# Patient Record
Sex: Female | Born: 1950 | Race: White | Hispanic: No | Smoking: Former smoker
Health system: Southern US, Community
[De-identification: ages and names within clinical notes are randomized; demographics above are authoritative.]

## PROBLEM LIST (undated history)

## (undated) DIAGNOSIS — K219 Gastro-esophageal reflux disease without esophagitis: Secondary | ICD-10-CM

## (undated) DIAGNOSIS — Z0000 Encounter for general adult medical examination without abnormal findings: Secondary | ICD-10-CM

## (undated) DIAGNOSIS — E785 Hyperlipidemia, unspecified: Secondary | ICD-10-CM

## (undated) DIAGNOSIS — F5104 Psychophysiologic insomnia: Secondary | ICD-10-CM

## (undated) DIAGNOSIS — M1990 Unspecified osteoarthritis, unspecified site: Secondary | ICD-10-CM

## (undated) DIAGNOSIS — G5603 Carpal tunnel syndrome, bilateral upper limbs: Secondary | ICD-10-CM

## (undated) DIAGNOSIS — D219 Benign neoplasm of connective and other soft tissue, unspecified: Secondary | ICD-10-CM

## (undated) DIAGNOSIS — Q669 Congenital deformity of feet, unspecified, unspecified foot: Secondary | ICD-10-CM

## (undated) DIAGNOSIS — Z720 Tobacco use: Secondary | ICD-10-CM

## (undated) DIAGNOSIS — G629 Polyneuropathy, unspecified: Secondary | ICD-10-CM

## (undated) DIAGNOSIS — D509 Iron deficiency anemia, unspecified: Secondary | ICD-10-CM

## (undated) DIAGNOSIS — Z62810 Personal history of physical and sexual abuse in childhood: Secondary | ICD-10-CM

## (undated) DIAGNOSIS — F329 Major depressive disorder, single episode, unspecified: Secondary | ICD-10-CM

## (undated) DIAGNOSIS — I4510 Unspecified right bundle-branch block: Secondary | ICD-10-CM

## (undated) DIAGNOSIS — L409 Psoriasis, unspecified: Secondary | ICD-10-CM

## (undated) DIAGNOSIS — F32A Depression, unspecified: Secondary | ICD-10-CM

## (undated) DIAGNOSIS — N393 Stress incontinence (female) (male): Secondary | ICD-10-CM

## (undated) HISTORY — DX: Polyneuropathy, unspecified: G62.9

## (undated) HISTORY — DX: Tobacco use: Z72.0

## (undated) HISTORY — DX: Stress incontinence (female) (male): N39.3

## (undated) HISTORY — DX: Psychophysiologic insomnia: F51.04

## (undated) HISTORY — DX: Hyperlipidemia, unspecified: E78.5

## (undated) HISTORY — DX: Benign neoplasm of connective and other soft tissue, unspecified: D21.9

## (undated) HISTORY — DX: Personal history of physical and sexual abuse in childhood: Z62.810

## (undated) HISTORY — DX: Iron deficiency anemia, unspecified: D50.9

## (undated) HISTORY — DX: Encounter for general adult medical examination without abnormal findings: Z00.00

## (undated) HISTORY — DX: Carpal tunnel syndrome, bilateral upper limbs: G56.03

## (undated) HISTORY — DX: Major depressive disorder, single episode, unspecified: F32.9

## (undated) HISTORY — DX: Depression, unspecified: F32.A

## (undated) HISTORY — PX: OTHER SURGICAL HISTORY: SHX169

## (undated) HISTORY — DX: Unspecified right bundle-branch block: I45.10

## (undated) HISTORY — DX: Gastro-esophageal reflux disease without esophagitis: K21.9

## (undated) HISTORY — DX: Congenital deformity of feet, unspecified, unspecified foot: Q66.90

## (undated) HISTORY — DX: Psoriasis, unspecified: L40.9

## (undated) HISTORY — DX: Unspecified osteoarthritis, unspecified site: M19.90

---

## 2001-03-27 ENCOUNTER — Emergency Department (HOSPITAL_COMMUNITY): Admission: EM | Admit: 2001-03-27 | Discharge: 2001-03-27 | Payer: Self-pay | Admitting: Emergency Medicine

## 2001-04-17 ENCOUNTER — Other Ambulatory Visit: Admission: RE | Admit: 2001-04-17 | Discharge: 2001-04-17 | Payer: Self-pay | Admitting: Obstetrics and Gynecology

## 2003-07-19 ENCOUNTER — Encounter: Admission: RE | Admit: 2003-07-19 | Discharge: 2003-07-19 | Payer: Self-pay | Admitting: Internal Medicine

## 2003-07-25 ENCOUNTER — Encounter: Admission: RE | Admit: 2003-07-25 | Discharge: 2003-07-25 | Payer: Self-pay | Admitting: Internal Medicine

## 2003-08-02 ENCOUNTER — Encounter (INDEPENDENT_AMBULATORY_CARE_PROVIDER_SITE_OTHER): Payer: Self-pay | Admitting: Internal Medicine

## 2003-08-02 ENCOUNTER — Encounter: Admission: RE | Admit: 2003-08-02 | Discharge: 2003-08-02 | Payer: Self-pay | Admitting: Internal Medicine

## 2003-09-05 ENCOUNTER — Encounter: Admission: RE | Admit: 2003-09-05 | Discharge: 2003-09-05 | Payer: Self-pay | Admitting: Internal Medicine

## 2003-09-06 ENCOUNTER — Encounter (INDEPENDENT_AMBULATORY_CARE_PROVIDER_SITE_OTHER): Payer: Self-pay | Admitting: Internal Medicine

## 2003-12-26 ENCOUNTER — Encounter: Admission: RE | Admit: 2003-12-26 | Discharge: 2003-12-26 | Payer: Self-pay | Admitting: Internal Medicine

## 2004-07-13 ENCOUNTER — Emergency Department (HOSPITAL_COMMUNITY): Admission: EM | Admit: 2004-07-13 | Discharge: 2004-07-13 | Payer: Self-pay | Admitting: Family Medicine

## 2004-09-04 ENCOUNTER — Ambulatory Visit: Payer: Self-pay | Admitting: Internal Medicine

## 2004-09-16 ENCOUNTER — Ambulatory Visit (HOSPITAL_COMMUNITY): Admission: RE | Admit: 2004-09-16 | Discharge: 2004-09-16 | Payer: Self-pay | Admitting: Internal Medicine

## 2004-11-27 ENCOUNTER — Ambulatory Visit: Payer: Self-pay | Admitting: Internal Medicine

## 2005-01-26 ENCOUNTER — Encounter (INDEPENDENT_AMBULATORY_CARE_PROVIDER_SITE_OTHER): Payer: Self-pay | Admitting: Internal Medicine

## 2005-01-26 ENCOUNTER — Ambulatory Visit (HOSPITAL_COMMUNITY): Admission: RE | Admit: 2005-01-26 | Discharge: 2005-01-26 | Payer: Self-pay | Admitting: Internal Medicine

## 2005-04-19 ENCOUNTER — Emergency Department (HOSPITAL_COMMUNITY): Admission: EM | Admit: 2005-04-19 | Discharge: 2005-04-19 | Payer: Self-pay | Admitting: Family Medicine

## 2005-05-20 ENCOUNTER — Ambulatory Visit: Payer: Self-pay | Admitting: Internal Medicine

## 2005-11-30 ENCOUNTER — Emergency Department (HOSPITAL_COMMUNITY): Admission: EM | Admit: 2005-11-30 | Discharge: 2005-11-30 | Payer: Self-pay | Admitting: Family Medicine

## 2005-12-21 ENCOUNTER — Ambulatory Visit (HOSPITAL_COMMUNITY): Admission: RE | Admit: 2005-12-21 | Discharge: 2005-12-21 | Payer: Self-pay | Admitting: Hospitalist

## 2005-12-21 ENCOUNTER — Ambulatory Visit: Payer: Self-pay | Admitting: Hospitalist

## 2006-03-17 ENCOUNTER — Ambulatory Visit: Payer: Self-pay | Admitting: Hospitalist

## 2006-07-12 ENCOUNTER — Ambulatory Visit: Payer: Self-pay | Admitting: Internal Medicine

## 2006-10-22 DIAGNOSIS — F17200 Nicotine dependence, unspecified, uncomplicated: Secondary | ICD-10-CM

## 2006-10-22 DIAGNOSIS — M129 Arthropathy, unspecified: Secondary | ICD-10-CM | POA: Insufficient documentation

## 2006-10-22 DIAGNOSIS — K219 Gastro-esophageal reflux disease without esophagitis: Secondary | ICD-10-CM

## 2007-09-11 ENCOUNTER — Ambulatory Visit: Payer: Self-pay | Admitting: Internal Medicine

## 2007-09-11 ENCOUNTER — Encounter (INDEPENDENT_AMBULATORY_CARE_PROVIDER_SITE_OTHER): Payer: Self-pay | Admitting: Internal Medicine

## 2007-12-19 ENCOUNTER — Emergency Department (HOSPITAL_COMMUNITY): Admission: EM | Admit: 2007-12-19 | Discharge: 2007-12-19 | Payer: Self-pay | Admitting: Emergency Medicine

## 2008-01-11 ENCOUNTER — Ambulatory Visit (HOSPITAL_COMMUNITY): Admission: RE | Admit: 2008-01-11 | Discharge: 2008-01-11 | Payer: Self-pay | Admitting: Internal Medicine

## 2008-01-11 ENCOUNTER — Ambulatory Visit: Payer: Self-pay | Admitting: Internal Medicine

## 2008-01-15 ENCOUNTER — Telehealth (INDEPENDENT_AMBULATORY_CARE_PROVIDER_SITE_OTHER): Payer: Self-pay | Admitting: Internal Medicine

## 2008-01-29 ENCOUNTER — Emergency Department (HOSPITAL_COMMUNITY): Admission: EM | Admit: 2008-01-29 | Discharge: 2008-01-29 | Payer: Self-pay | Admitting: Family Medicine

## 2008-03-06 ENCOUNTER — Ambulatory Visit: Payer: Self-pay | Admitting: *Deleted

## 2008-03-14 ENCOUNTER — Telehealth (INDEPENDENT_AMBULATORY_CARE_PROVIDER_SITE_OTHER): Payer: Self-pay | Admitting: Internal Medicine

## 2008-03-15 ENCOUNTER — Ambulatory Visit: Payer: Self-pay | Admitting: Internal Medicine

## 2008-03-15 ENCOUNTER — Encounter (INDEPENDENT_AMBULATORY_CARE_PROVIDER_SITE_OTHER): Payer: Self-pay | Admitting: *Deleted

## 2008-03-15 ENCOUNTER — Encounter: Payer: Self-pay | Admitting: Licensed Clinical Social Worker

## 2008-03-15 DIAGNOSIS — G4700 Insomnia, unspecified: Secondary | ICD-10-CM

## 2008-03-15 LAB — CONVERTED CEMR LAB
Anti Nuclear Antibody(ANA): NEGATIVE
Basophils Absolute: 0 10*3/uL (ref 0.0–0.1)
Basophils Relative: 0 % (ref 0–1)
Eosinophils Absolute: 0.1 10*3/uL (ref 0.0–0.7)
Eosinophils Relative: 1 % (ref 0–5)
HCT: 38.6 % (ref 36.0–46.0)
Hemoglobin: 12.1 g/dL (ref 12.0–15.0)
Lymphocytes Relative: 29 % (ref 12–46)
Lymphs Abs: 1.8 10*3/uL (ref 0.7–4.0)
MCHC: 31.3 g/dL (ref 30.0–36.0)
MCV: 83.9 fL (ref 78.0–100.0)
Monocytes Absolute: 0.4 10*3/uL (ref 0.1–1.0)
Monocytes Relative: 6 % (ref 3–12)
Neutro Abs: 3.9 10*3/uL (ref 1.7–7.7)
Neutrophils Relative %: 64 % (ref 43–77)
Platelets: 301 10*3/uL (ref 150–400)
RBC: 4.6 M/uL (ref 3.87–5.11)
RDW: 16 % — ABNORMAL HIGH (ref 11.5–15.5)
Rheumatoid fact SerPl-aCnc: <20 intl units/mL (ref 0–20)
Sed Rate: 7 mm/hr (ref 0–22)
WBC: 6.2 10*3/uL (ref 4.0–10.5)

## 2008-03-25 ENCOUNTER — Ambulatory Visit: Payer: Self-pay | Admitting: Internal Medicine

## 2008-03-25 DIAGNOSIS — L408 Other psoriasis: Secondary | ICD-10-CM

## 2008-03-25 DIAGNOSIS — N39498 Other specified urinary incontinence: Secondary | ICD-10-CM | POA: Insufficient documentation

## 2008-04-09 ENCOUNTER — Telehealth (INDEPENDENT_AMBULATORY_CARE_PROVIDER_SITE_OTHER): Payer: Self-pay | Admitting: Infectious Diseases

## 2008-05-15 ENCOUNTER — Encounter: Payer: Self-pay | Admitting: Obstetrics and Gynecology

## 2008-05-15 ENCOUNTER — Ambulatory Visit: Payer: Self-pay | Admitting: Obstetrics and Gynecology

## 2008-05-29 ENCOUNTER — Ambulatory Visit (HOSPITAL_COMMUNITY): Admission: RE | Admit: 2008-05-29 | Discharge: 2008-05-29 | Payer: Self-pay | Admitting: Gynecology

## 2008-06-05 ENCOUNTER — Encounter (INDEPENDENT_AMBULATORY_CARE_PROVIDER_SITE_OTHER): Payer: Self-pay | Admitting: Internal Medicine

## 2008-06-13 ENCOUNTER — Other Ambulatory Visit: Admission: RE | Admit: 2008-06-13 | Discharge: 2008-06-13 | Payer: Self-pay | Admitting: Obstetrics and Gynecology

## 2008-06-13 ENCOUNTER — Ambulatory Visit: Payer: Self-pay | Admitting: Obstetrics & Gynecology

## 2008-06-28 ENCOUNTER — Ambulatory Visit: Payer: Self-pay | Admitting: *Deleted

## 2008-07-03 ENCOUNTER — Ambulatory Visit: Payer: Self-pay | Admitting: Infectious Diseases

## 2008-07-03 ENCOUNTER — Ambulatory Visit (HOSPITAL_COMMUNITY): Admission: RE | Admit: 2008-07-03 | Discharge: 2008-07-03 | Payer: Self-pay | Admitting: Family Medicine

## 2008-07-11 ENCOUNTER — Ambulatory Visit: Payer: Self-pay | Admitting: Obstetrics and Gynecology

## 2008-08-20 ENCOUNTER — Telehealth: Payer: Self-pay | Admitting: *Deleted

## 2008-09-05 ENCOUNTER — Ambulatory Visit: Payer: Self-pay | Admitting: Internal Medicine

## 2008-09-05 ENCOUNTER — Encounter (INDEPENDENT_AMBULATORY_CARE_PROVIDER_SITE_OTHER): Payer: Self-pay | Admitting: *Deleted

## 2008-09-05 DIAGNOSIS — K5909 Other constipation: Secondary | ICD-10-CM

## 2008-09-17 ENCOUNTER — Encounter (INDEPENDENT_AMBULATORY_CARE_PROVIDER_SITE_OTHER): Payer: Self-pay | Admitting: *Deleted

## 2008-09-19 ENCOUNTER — Telehealth (INDEPENDENT_AMBULATORY_CARE_PROVIDER_SITE_OTHER): Payer: Self-pay | Admitting: *Deleted

## 2008-09-19 ENCOUNTER — Encounter (INDEPENDENT_AMBULATORY_CARE_PROVIDER_SITE_OTHER): Payer: Self-pay | Admitting: *Deleted

## 2008-09-20 ENCOUNTER — Encounter (INDEPENDENT_AMBULATORY_CARE_PROVIDER_SITE_OTHER): Payer: Self-pay | Admitting: *Deleted

## 2008-09-20 ENCOUNTER — Telehealth (INDEPENDENT_AMBULATORY_CARE_PROVIDER_SITE_OTHER): Payer: Self-pay | Admitting: *Deleted

## 2008-09-20 ENCOUNTER — Ambulatory Visit: Payer: Self-pay | Admitting: Infectious Disease

## 2008-09-20 LAB — CONVERTED CEMR LAB
HCT: 36.9 % (ref 36.0–46.0)
Hemoglobin: 11.1 g/dL — ABNORMAL LOW (ref 12.0–15.0)
MCHC: 30.1 g/dL (ref 30.0–36.0)
MCV: 81.6 fL (ref 78.0–100.0)
Platelets: 321 10*3/uL (ref 150–400)
RBC Folate: 624 ng/mL — ABNORMAL HIGH (ref 180–600)
RBC: 4.52 M/uL (ref 3.87–5.11)
RDW: 15.8 % — ABNORMAL HIGH (ref 11.5–15.5)
TSH: 1.8 microintl units/mL (ref 0.350–4.50)
WBC: 5.5 10*3/uL (ref 4.0–10.5)

## 2008-09-30 DIAGNOSIS — D509 Iron deficiency anemia, unspecified: Secondary | ICD-10-CM

## 2008-09-30 LAB — CONVERTED CEMR LAB
Ferritin: 5 ng/mL — ABNORMAL LOW (ref 10–291)
Haptoglobin: 245 mg/dL — ABNORMAL HIGH (ref 16–200)
Iron: 53 ug/dL (ref 42–145)
LDH: 177 units/L (ref 94–250)
Saturation Ratios: 13 % — ABNORMAL LOW (ref 20–55)
TIBC: 414 ug/dL (ref 250–470)
Transferrin: 336 mg/dL (ref 212–360)
UIBC: 361 ug/dL
Vitamin B-12: 249 pg/mL (ref 211–911)

## 2008-10-01 ENCOUNTER — Telehealth (INDEPENDENT_AMBULATORY_CARE_PROVIDER_SITE_OTHER): Payer: Self-pay | Admitting: *Deleted

## 2008-10-03 ENCOUNTER — Ambulatory Visit: Payer: Self-pay | Admitting: Internal Medicine

## 2008-10-03 ENCOUNTER — Encounter (INDEPENDENT_AMBULATORY_CARE_PROVIDER_SITE_OTHER): Payer: Self-pay | Admitting: *Deleted

## 2008-10-03 ENCOUNTER — Telehealth (INDEPENDENT_AMBULATORY_CARE_PROVIDER_SITE_OTHER): Payer: Self-pay | Admitting: *Deleted

## 2008-10-07 ENCOUNTER — Ambulatory Visit (HOSPITAL_COMMUNITY): Admission: RE | Admit: 2008-10-07 | Discharge: 2008-10-07 | Payer: Self-pay | Admitting: Internal Medicine

## 2008-10-07 ENCOUNTER — Ambulatory Visit (HOSPITAL_COMMUNITY): Admission: RE | Admit: 2008-10-07 | Discharge: 2008-10-07 | Payer: Self-pay | Admitting: Obstetrics and Gynecology

## 2008-10-07 ENCOUNTER — Ambulatory Visit: Payer: Self-pay | Admitting: Internal Medicine

## 2008-10-07 ENCOUNTER — Encounter (INDEPENDENT_AMBULATORY_CARE_PROVIDER_SITE_OTHER): Payer: Self-pay | Admitting: *Deleted

## 2008-10-10 ENCOUNTER — Ambulatory Visit: Payer: Self-pay | Admitting: Obstetrics & Gynecology

## 2008-10-15 ENCOUNTER — Encounter (INDEPENDENT_AMBULATORY_CARE_PROVIDER_SITE_OTHER): Payer: Self-pay | Admitting: *Deleted

## 2008-10-15 ENCOUNTER — Ambulatory Visit (HOSPITAL_COMMUNITY): Admission: RE | Admit: 2008-10-15 | Discharge: 2008-10-15 | Payer: Self-pay | Admitting: *Deleted

## 2008-11-12 ENCOUNTER — Encounter: Admission: RE | Admit: 2008-11-12 | Discharge: 2008-11-12 | Payer: Self-pay | Admitting: Orthopedic Surgery

## 2008-11-18 ENCOUNTER — Telehealth (INDEPENDENT_AMBULATORY_CARE_PROVIDER_SITE_OTHER): Payer: Self-pay | Admitting: *Deleted

## 2008-11-21 ENCOUNTER — Ambulatory Visit: Payer: Self-pay | Admitting: Obstetrics & Gynecology

## 2009-01-01 ENCOUNTER — Encounter: Admission: RE | Admit: 2009-01-01 | Discharge: 2009-04-01 | Payer: Self-pay | Admitting: Orthopedic Surgery

## 2009-01-13 HISTORY — PX: VAGINAL HYSTERECTOMY: SUR661

## 2009-01-21 ENCOUNTER — Encounter: Payer: Self-pay | Admitting: Obstetrics & Gynecology

## 2009-01-21 ENCOUNTER — Inpatient Hospital Stay (HOSPITAL_COMMUNITY): Admission: RE | Admit: 2009-01-21 | Discharge: 2009-01-22 | Payer: Self-pay | Admitting: Obstetrics & Gynecology

## 2009-01-21 ENCOUNTER — Ambulatory Visit: Payer: Self-pay | Admitting: Obstetrics & Gynecology

## 2009-01-24 ENCOUNTER — Encounter (INDEPENDENT_AMBULATORY_CARE_PROVIDER_SITE_OTHER): Payer: Self-pay | Admitting: *Deleted

## 2009-02-17 ENCOUNTER — Telehealth (INDEPENDENT_AMBULATORY_CARE_PROVIDER_SITE_OTHER): Payer: Self-pay | Admitting: *Deleted

## 2009-02-18 ENCOUNTER — Telehealth (INDEPENDENT_AMBULATORY_CARE_PROVIDER_SITE_OTHER): Payer: Self-pay | Admitting: *Deleted

## 2009-02-26 ENCOUNTER — Encounter: Payer: Self-pay | Admitting: Obstetrics and Gynecology

## 2009-02-26 ENCOUNTER — Ambulatory Visit: Payer: Self-pay | Admitting: Obstetrics & Gynecology

## 2009-02-26 LAB — CONVERTED CEMR LAB
Clue Cells Wet Prep HPF POC: NONE SEEN
Trich, Wet Prep: NONE SEEN
WBC, Wet Prep HPF POC: NONE SEEN
Yeast Wet Prep HPF POC: NONE SEEN

## 2009-03-18 ENCOUNTER — Encounter (INDEPENDENT_AMBULATORY_CARE_PROVIDER_SITE_OTHER): Payer: Self-pay | Admitting: *Deleted

## 2009-03-18 ENCOUNTER — Ambulatory Visit: Payer: Self-pay | Admitting: Internal Medicine

## 2009-03-25 LAB — CONVERTED CEMR LAB
BUN: 12 mg/dL (ref 6–23)
Basophils Absolute: 0 10*3/uL (ref 0.0–0.1)
Basophils Relative: 0 % (ref 0–1)
CO2: 25 meq/L (ref 19–32)
Calcium: 9.7 mg/dL (ref 8.4–10.5)
Chloride: 103 meq/L (ref 96–112)
Creatinine, Ser: 0.71 mg/dL (ref 0.40–1.20)
Eosinophils Absolute: 0 10*3/uL (ref 0.0–0.7)
Eosinophils Relative: 1 % (ref 0–5)
GFR calc Af Amer: >60 mL/min (ref >60)
GFR calc non Af Amer: >60 mL/min (ref >60)
Glucose, Bld: 105 mg/dL — ABNORMAL HIGH (ref 70–99)
HCT: 39.7 % (ref 36.0–46.0)
Hemoglobin: 12.2 g/dL (ref 12.0–15.0)
Lymphocytes Relative: 33 % (ref 12–46)
Lymphs Abs: 1.7 10*3/uL (ref 0.7–4.0)
MCHC: 30.7 g/dL (ref 30.0–36.0)
MCV: 83.4 fL (ref 78.0–100.0)
Monocytes Absolute: 0.2 10*3/uL (ref 0.1–1.0)
Monocytes Relative: 4 % (ref 3–12)
Neutro Abs: 3.2 10*3/uL (ref 1.7–7.7)
Neutrophils Relative %: 62 % (ref 43–77)
Platelets: 287 10*3/uL (ref 150–400)
Potassium: 4.4 meq/L (ref 3.5–5.3)
RBC: 4.76 M/uL (ref 3.87–5.11)
RDW: 17.9 % — ABNORMAL HIGH (ref 11.5–15.5)
Sodium: 140 meq/L (ref 135–145)
Vitamin B-12: 262 pg/mL (ref 211–911)
WBC: 5.2 10*3/uL (ref 4.0–10.5)

## 2009-04-02 ENCOUNTER — Encounter: Admission: RE | Admit: 2009-04-02 | Discharge: 2009-07-01 | Payer: Self-pay | Admitting: Orthopedic Surgery

## 2009-04-18 ENCOUNTER — Ambulatory Visit: Payer: Self-pay | Admitting: Infectious Disease

## 2009-06-02 ENCOUNTER — Encounter (INDEPENDENT_AMBULATORY_CARE_PROVIDER_SITE_OTHER): Payer: Self-pay | Admitting: *Deleted

## 2009-06-24 IMAGING — CR DG FEET 3 VIEWS BILAT
6 series · 6 of 6 positions shown · non-contrast
Comparison: none

CLINICAL DATA: Increased pain.  
BILATERAL ANKLE ?3 VIEW:

[t foot ap left]
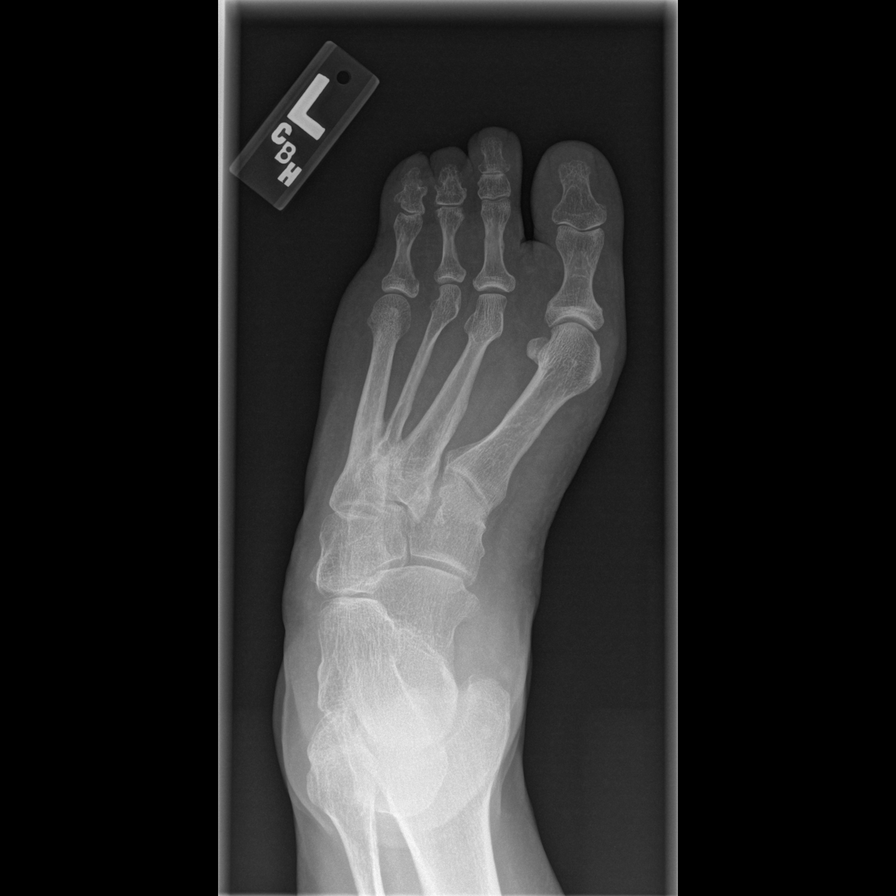

[t foot oblique left]
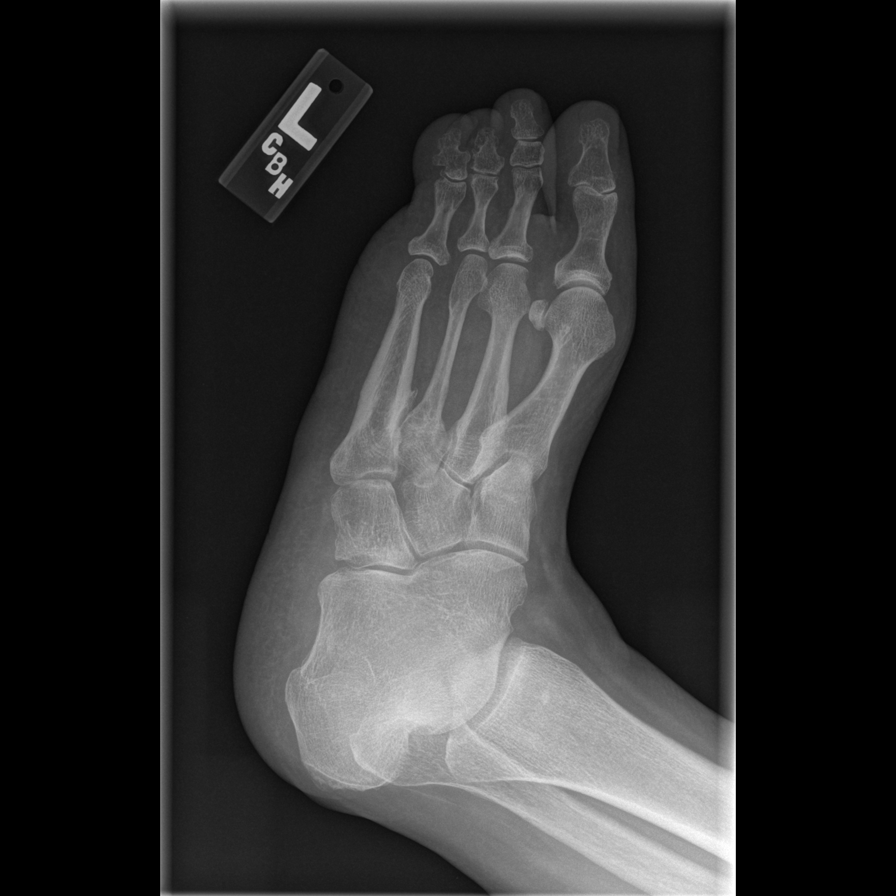

[t foot lat left]
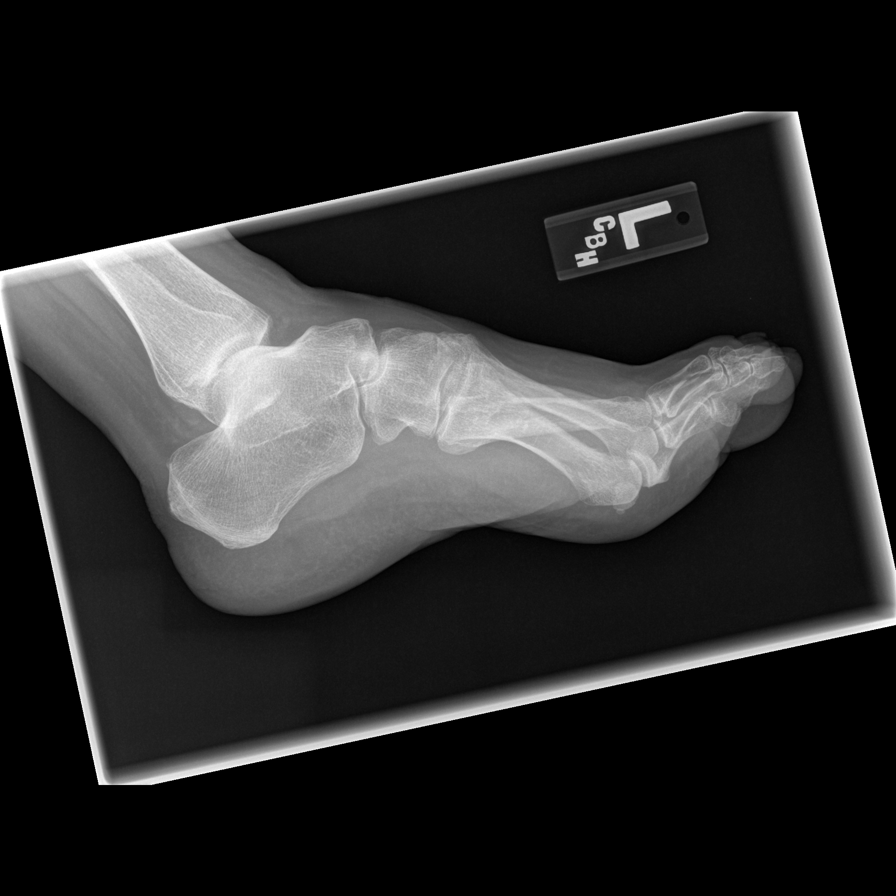

[t foot ap right]
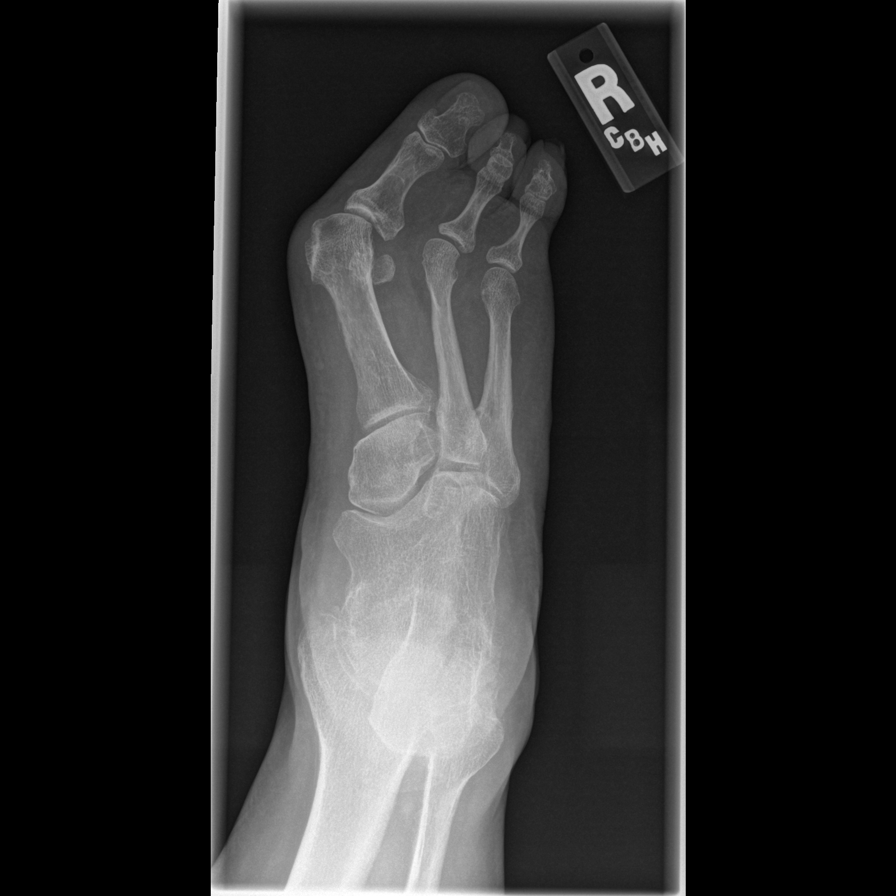

[t foot oblique right]
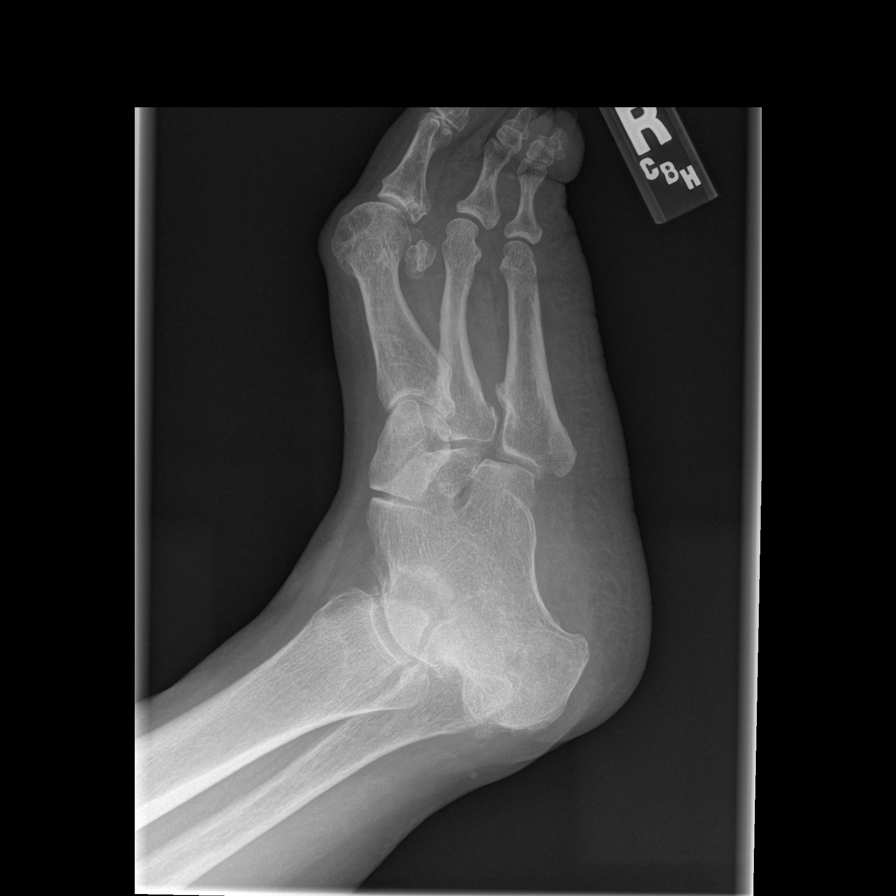

[t foot lat right]
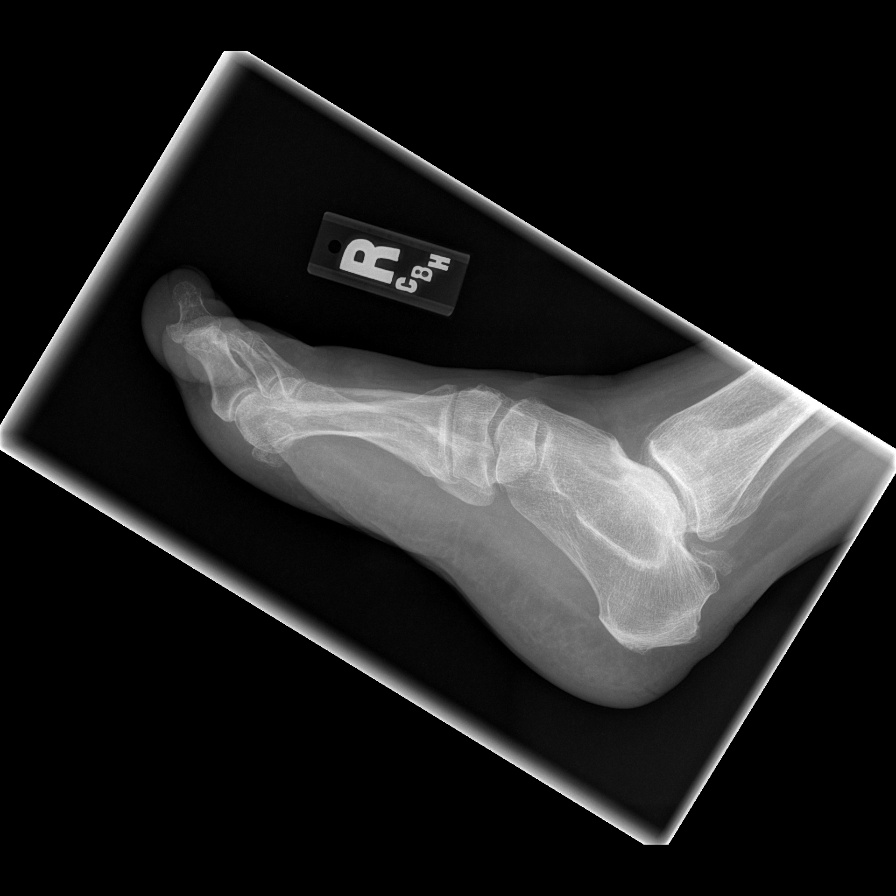

[6 of 6 positions shown; findings below may reference images not displayed]

FINDINGS: Left ankle demonstrates mild degenerative change at the tibiotalar and fibulotalar joints.  There is no acute injury.  Right ankle demonstrates mild degenerative change of the tibiotalar and fibulotalar joints, but there is no acute injury.  See bilateral foot discussion for additional findings.
IMPRESSION: No acute abnormality.  
BILATERAL FEET ? 3 VIEW:
FINDINGS: There is general deformity of both feet.  There are only four digits on the left.  The talus and calcaneus are fused.  There is a reduced number of tarsal bone in the midfoot.  There is no acute fracture.  Three views of the right foot demonstrate only 3 toes.  The talus and calcaneus are fused.  There is a reduced number of mid tarsal bones.  There is early degenerative change at the tarsometatarsal joints.  No acute fracture is seen.
IMPRESSION: No acute findings.  Congenital deformity as described.

## 2009-07-07 ENCOUNTER — Telehealth: Payer: Self-pay | Admitting: Internal Medicine

## 2009-08-21 ENCOUNTER — Ambulatory Visit: Payer: Self-pay | Admitting: Internal Medicine

## 2009-08-21 DIAGNOSIS — F329 Major depressive disorder, single episode, unspecified: Secondary | ICD-10-CM

## 2009-08-21 DIAGNOSIS — F3289 Other specified depressive episodes: Secondary | ICD-10-CM | POA: Insufficient documentation

## 2009-08-22 ENCOUNTER — Ambulatory Visit (HOSPITAL_COMMUNITY): Admission: RE | Admit: 2009-08-22 | Discharge: 2009-08-22 | Payer: Self-pay | Admitting: Family Medicine

## 2009-08-22 LAB — CONVERTED CEMR LAB
ALT: 23 units/L (ref 0–35)
AST: 13 units/L (ref 0–37)
Albumin: 4.9 g/dL (ref 3.5–5.2)
Alkaline Phosphatase: 125 units/L — ABNORMAL HIGH (ref 39–117)
Bilirubin, Direct: 0.1 mg/dL (ref 0.0–0.3)
Cholesterol: 298 mg/dL — ABNORMAL HIGH (ref 0–200)
HDL: 49 mg/dL (ref >39)
Indirect Bilirubin: 0.2 mg/dL (ref 0.0–0.9)
LDL Cholesterol: 202 mg/dL — ABNORMAL HIGH (ref 0–99)
TSH: 1.722 microintl units/mL (ref 0.350–4.5)
Total Bilirubin: 0.3 mg/dL (ref 0.3–1.2)
Total CHOL/HDL Ratio: 6.1
Total Protein: 7.5 g/dL (ref 6.0–8.3)
Triglycerides: 233 mg/dL — ABNORMAL HIGH (ref <150)
VLDL: 47 mg/dL — ABNORMAL HIGH (ref 0–40)

## 2009-08-27 ENCOUNTER — Encounter: Payer: Self-pay | Admitting: Internal Medicine

## 2009-08-29 ENCOUNTER — Encounter: Admission: RE | Admit: 2009-08-29 | Discharge: 2009-08-29 | Payer: Self-pay | Admitting: Family Medicine

## 2009-10-01 ENCOUNTER — Telehealth: Payer: Self-pay | Admitting: Internal Medicine

## 2009-10-03 ENCOUNTER — Encounter: Payer: Self-pay | Admitting: Internal Medicine

## 2009-10-22 ENCOUNTER — Telehealth: Payer: Self-pay | Admitting: Internal Medicine

## 2009-11-10 ENCOUNTER — Telehealth: Payer: Self-pay | Admitting: Internal Medicine

## 2009-11-25 ENCOUNTER — Ambulatory Visit: Payer: Self-pay | Admitting: Infectious Diseases

## 2009-12-09 ENCOUNTER — Telehealth (INDEPENDENT_AMBULATORY_CARE_PROVIDER_SITE_OTHER): Payer: Self-pay | Admitting: *Deleted

## 2010-01-01 ENCOUNTER — Telehealth: Payer: Self-pay | Admitting: Internal Medicine

## 2010-01-26 ENCOUNTER — Emergency Department (HOSPITAL_COMMUNITY): Admission: EM | Admit: 2010-01-26 | Discharge: 2010-01-26 | Payer: Self-pay | Admitting: Family Medicine

## 2010-01-28 ENCOUNTER — Telehealth: Payer: Self-pay | Admitting: *Deleted

## 2010-02-25 ENCOUNTER — Telehealth: Payer: Self-pay | Admitting: Internal Medicine

## 2010-03-24 ENCOUNTER — Telehealth: Payer: Self-pay | Admitting: Internal Medicine

## 2010-04-20 ENCOUNTER — Telehealth: Payer: Self-pay | Admitting: Internal Medicine

## 2010-04-28 ENCOUNTER — Emergency Department (HOSPITAL_COMMUNITY): Admission: EM | Admit: 2010-04-28 | Discharge: 2010-04-28 | Payer: Self-pay | Admitting: Family Medicine

## 2010-05-06 ENCOUNTER — Telehealth: Payer: Self-pay | Admitting: Internal Medicine

## 2010-05-22 ENCOUNTER — Telehealth: Payer: Self-pay | Admitting: Internal Medicine

## 2010-05-25 ENCOUNTER — Telehealth: Payer: Self-pay | Admitting: Internal Medicine

## 2010-06-18 ENCOUNTER — Telehealth: Payer: Self-pay | Admitting: Internal Medicine

## 2010-07-17 ENCOUNTER — Telehealth: Payer: Self-pay | Admitting: Internal Medicine

## 2010-08-14 ENCOUNTER — Telehealth: Payer: Self-pay | Admitting: Internal Medicine

## 2010-09-01 ENCOUNTER — Ambulatory Visit: Payer: Self-pay | Admitting: Internal Medicine

## 2010-09-01 DIAGNOSIS — G609 Hereditary and idiopathic neuropathy, unspecified: Secondary | ICD-10-CM | POA: Insufficient documentation

## 2010-09-04 LAB — CONVERTED CEMR LAB
ALT: 22 units/L (ref 0–35)
AST: 17 units/L (ref 0–37)
Albumin: 4.7 g/dL (ref 3.5–5.2)
Alkaline Phosphatase: 87 units/L (ref 39–117)
Anti Nuclear Antibody(ANA): NEGATIVE
BUN: 10 mg/dL (ref 6–23)
Basophils Absolute: 0 10*3/uL (ref 0.0–0.1)
Basophils Relative: 0 % (ref 0–1)
CO2: 28 meq/L (ref 19–32)
Calcium: 9.4 mg/dL (ref 8.4–10.5)
Chloride: 103 meq/L (ref 96–112)
Creatinine, Ser: 0.7 mg/dL (ref 0.40–1.20)
Eosinophils Absolute: 0 10*3/uL (ref 0.0–0.7)
Eosinophils Relative: 1 % (ref 0–5)
Glucose, Bld: 101 mg/dL — ABNORMAL HIGH (ref 70–99)
HCT: 37.5 % (ref 36.0–46.0)
Hemoglobin: 11.9 g/dL — ABNORMAL LOW (ref 12.0–15.0)
Homocysteine: 11.2 micromoles/L (ref 4.0–15.4)
Lymphocytes Relative: 35 % (ref 12–46)
Lymphs Abs: 1.7 10*3/uL (ref 0.7–4.0)
MCHC: 31.7 g/dL (ref 30.0–36.0)
MCV: 85.4 fL (ref >=78.0)
Monocytes Absolute: 0.3 10*3/uL (ref 0.1–1.0)
Monocytes Relative: 7 % (ref 3–12)
Neutro Abs: 2.9 10*3/uL (ref 1.7–7.7)
Neutrophils Relative %: 58 % (ref 43–77)
Platelets: 275 10*3/uL (ref 150–400)
Potassium: 3.8 meq/L (ref 3.5–5.3)
RBC: 4.39 M/uL (ref 3.87–5.11)
RDW: 14.3 % (ref 11.5–15.5)
Rheumatoid fact SerPl-aCnc: <20 intl units/mL (ref 0–20)
Sed Rate: 8 mm/hr (ref 0–22)
Sodium: 141 meq/L (ref 135–145)
TSH: 1.367 microintl units/mL (ref 0.350–4.5)
Total Bilirubin: 0.3 mg/dL (ref 0.3–1.2)
Total Protein: 7 g/dL (ref 6.0–8.3)
WBC: 5 10*3/uL (ref 4.0–10.5)

## 2010-09-15 ENCOUNTER — Telehealth: Payer: Self-pay | Admitting: Internal Medicine

## 2010-09-17 ENCOUNTER — Ambulatory Visit: Payer: Self-pay | Admitting: Internal Medicine

## 2010-09-17 DIAGNOSIS — E785 Hyperlipidemia, unspecified: Secondary | ICD-10-CM

## 2010-09-17 LAB — CONVERTED CEMR LAB: LDL Goal: 160 mg/dL

## 2010-09-18 LAB — CONVERTED CEMR LAB
Cholesterol: 275 mg/dL — ABNORMAL HIGH (ref 0–200)
LDL Cholesterol: 191 mg/dL — ABNORMAL HIGH (ref 0–99)
Total CHOL/HDL Ratio: 6.5
Triglycerides: 212 mg/dL — ABNORMAL HIGH (ref <150)
VLDL: 42 mg/dL — ABNORMAL HIGH (ref 0–40)

## 2010-09-29 ENCOUNTER — Encounter: Admission: RE | Admit: 2010-09-29 | Discharge: 2010-09-29 | Payer: Self-pay | Admitting: Family Medicine

## 2010-10-01 ENCOUNTER — Ambulatory Visit: Payer: Self-pay | Admitting: Internal Medicine

## 2010-10-06 ENCOUNTER — Telehealth: Payer: Self-pay | Admitting: Internal Medicine

## 2010-10-07 ENCOUNTER — Telehealth: Payer: Self-pay | Admitting: Internal Medicine

## 2010-10-08 ENCOUNTER — Telehealth: Payer: Self-pay | Admitting: Internal Medicine

## 2010-10-08 ENCOUNTER — Encounter: Admission: RE | Admit: 2010-10-08 | Discharge: 2010-10-08 | Payer: Self-pay | Admitting: Family Medicine

## 2010-10-16 ENCOUNTER — Telehealth: Payer: Self-pay | Admitting: Internal Medicine

## 2010-10-16 ENCOUNTER — Encounter: Payer: Self-pay | Admitting: Internal Medicine

## 2010-11-02 ENCOUNTER — Ambulatory Visit: Payer: Self-pay | Admitting: Internal Medicine

## 2010-11-09 ENCOUNTER — Telehealth: Payer: Self-pay | Admitting: Internal Medicine

## 2010-11-11 ENCOUNTER — Telehealth: Payer: Self-pay | Admitting: Internal Medicine

## 2010-11-16 ENCOUNTER — Telehealth: Payer: Self-pay | Admitting: Internal Medicine

## 2010-12-17 ENCOUNTER — Ambulatory Visit: Admit: 2010-12-17 | Payer: Self-pay

## 2010-12-21 ENCOUNTER — Telehealth: Payer: Self-pay | Admitting: Internal Medicine

## 2011-01-03 ENCOUNTER — Encounter: Payer: Self-pay | Admitting: Internal Medicine

## 2011-01-04 ENCOUNTER — Encounter: Payer: Self-pay | Admitting: *Deleted

## 2011-01-06 ENCOUNTER — Telehealth (INDEPENDENT_AMBULATORY_CARE_PROVIDER_SITE_OTHER): Payer: Self-pay | Admitting: *Deleted

## 2011-01-11 ENCOUNTER — Telehealth (INDEPENDENT_AMBULATORY_CARE_PROVIDER_SITE_OTHER): Payer: Self-pay | Admitting: *Deleted
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
## 2011-01-12 NOTE — Progress Notes (Signed)
Summary: med refill/gp  Phone Note Refill Request Message from:  Fax from Pharmacy on October 07, 2010 1:55 PM  Refills Requested: Medication #1:  VICODIN 5-500 MG TABS Take 1 tablet by mouth four times a day  as needed   Last Refilled: 09/15/2010  Method Requested: Telephone to Pharmacy Initial call taken by: Chinita Pester RN,  October 07, 2010 1:55 PM     Prescriptions: VICODIN 5-500 MG TABS (HYDROCODONE-ACETAMINOPHEN) Take 1 tablet by mouth four times a day  as needed  #60 x 0   Entered and Authorized by:   Laren Everts MD   Signed by:   Laren Everts MD on 10/07/2010   Method used:   Telephoned to ...       [id number] ([facility]       [address]       [place], [place]  [address]       Ph: [phone] or [phone]       Fax: [phone]   RxID:   [id number]   Appended Document: med refill/gp Vicodin Rx  called [facility] [address]

## 2011-01-12 NOTE — Progress Notes (Signed)
Summary: refill/gg  Phone Note Refill Request  on November 09, 2010 5:02 PM  Refills Requested: Medication #1:  MOBIC 15 MG TABS Take one tablet by mouth once a day.   Last Refilled: 10/07/2010  Medication #2:  VICODIN 5-500 MG TABS Take 1 tablet by mouth four times a day  as needed   Last Refilled: 10/07/2010 Pt needs pain contract   Method Requested: Fax to Local Pharmacy Initial call taken by: Merrie Roof RN,  November 09, 2010 5:02 PM    Prescriptions: VICODIN 5-500 MG TABS (HYDROCODONE-ACETAMINOPHEN) Take 1 tablet by mouth four times a day  as needed  #60 x 0   Entered and Authorized by:   Laren Everts MD   Signed by:   Laren Everts MD on 11/12/2010   Method used:   Telephoned to ...       [id number] ([facility]       [address]       [place], [place]  [address]       Ph: [phone] or [phone]       Fax: [phone]   RxID:   [id number]   Appended Document: refill/gg This med was called in on 11/29

## 2011-01-12 NOTE — Progress Notes (Signed)
Summary: medication/gp  Phone Note Other Incoming   [facility] Summary of Call: The pharmacist stated pt. received Hydrocodone 5/325mg  #90 from Dr. Lita Mains, on 09/29/10.  And wants to know if you still want them to refill Vicodin 5/500mg  which I called in yesterday 10/26.   Thanks Initial call taken by: Chinita Pester RN,  October 08, 2010 11:57 AM    No do not fill prescription sent on 10/26. Thank you.  Appended Document: medication/gp [facility] [address] [facility] and informed not refill Rx called in on 10/26 per Dr. Shary Key.

## 2011-01-12 NOTE — Progress Notes (Signed)
Summary: refill/gg  Phone Note Refill Request  on March 24, 2010 9:35 AM  Refills Requested: Medication #1:  AMBIEN 5 MG  TABS Take 1 tablet at bedtime as needed for sleep.  Do not take Benadryl while taking Ambien.   Last Refilled: 02/20/2010  Method Requested: Electronic Initial call taken by: Merrie Roof RN,  March 24, 2010 9:35 AM    Prescriptions: AMBIEN 5 MG  TABS (ZOLPIDEM TARTRATE) Take 1 tablet at bedtime as needed for sleep.  Do not take Benadryl while taking Ambien.  #30 x 1   Entered and Authorized by:   Laren Everts MD   Signed by:   Laren Everts MD on 03/24/2010   Method used:   Telephoned to ...       [id number] ([facility]       [address]       [place], [place]  [address]       Ph: [phone] or [phone]       Fax: [phone]   RxID:   [id number]

## 2011-01-12 NOTE — Progress Notes (Signed)
Summary: refill/gg  Phone Note Refill Request  on June 18, 2010 5:12 PM   Medication #2:  ZOLOFT 50 MG TABS Take 1 tablet by mouth once a day  Method Requested: Fax to Local Pharmacy Initial call taken by: Merrie Roof RN,  June 18, 2010 5:13 PM    Prescriptions: ZOLOFT 50 MG TABS (SERTRALINE HCL) Take 1 tablet by mouth once a day  #30 x 3   Entered and Authorized by:   Laren Everts MD   Signed by:   Laren Everts MD on 06/19/2010   Method used:   Electronically to        [id number] ([facility]       [address]       [place], [place]  [address]       Ph: [phone] or [phone]       Fax: [phone]   RxID:   [id number]

## 2011-01-12 NOTE — Progress Notes (Signed)
Summary: prior authorization/gp  Phone Note From Other Clinic   Summary of Call: Prior authorization for Zolpidem 5mg   has been approved for 6 months starting on 01/13/10.  [facility] [address] [facility] awared. Initial call taken by: Chinita Pester RN,  January 28, 2010 9:48 AM

## 2011-01-12 NOTE — Progress Notes (Signed)
Summary: refill/gg  Phone Note Refill Request  on August 14, 2010 12:16 PM  Refills Requested: Medication #1:  VICODIN 5-500 MG TABS Take 1 tablet by mouth four times a day  as needed   Last Refilled: 07/17/2010  Medication #2:  AMBIEN 5 MG  TABS Take 1 tablet at bedtime as needed for sleep.  Do not take Benadryl while taking Ambien.  Method Requested: Telephone to Pharmacy Initial call taken by: Merrie Roof RN,  August 14, 2010 12:16 PM    Patient needs to be seen in the clinic for evaluation before medication can be refilled to assess the need for further management.   Appended Document: refill/gg Tried to get in touch with pt.  No answer on her phone.  Message left to call clinic

## 2011-01-12 NOTE — Progress Notes (Signed)
Summary: Refill/gh  Phone Note Refill Request Message from:  Fax from Pharmacy on November 16, 2010 11:48 AM  Refills Requested: Medication #1:  MOBIC 15 MG TABS Take one tablet by mouth once a day.   Last Refilled: 10/07/2010  Method Requested: Electronic Initial call taken by: Angelina Ok RN,  November 16, 2010 11:49 AM    Prescriptions: MOBIC 15 MG TABS (MELOXICAM) Take one tablet by mouth once a day.  #30 x 0   Entered and Authorized by:   Laren Everts MD   Signed by:   Laren Everts MD on 11/16/2010   Method used:   Electronically to        [id number]       [address]       [place], [place]  [address]       Ph: [phone] or [phone]       Fax: [phone]   RxID:   [id number]
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
## 2011-01-12 NOTE — Progress Notes (Signed)
Summary: Refill/gh  Phone Note Refill Request Message from:  Fax from Pharmacy on May 22, 2010 9:30 AM  Refills Requested: Medication #1:  AMBIEN 5 MG  TABS Take 1 tablet at bedtime as needed for sleep.  Do not take Benadryl while taking Ambien.   Last Refilled: 03/25/2010  Method Requested: Electronic Initial call taken by: Angelina Ok RN,  May 22, 2010 9:31 AM    Prescriptions: AMBIEN 5 MG  TABS (ZOLPIDEM TARTRATE) Take 1 tablet at bedtime as needed for sleep.  Do not take Benadryl while taking Ambien.  #30 x 1   Entered and Authorized by:   Laren Everts MD   Signed by:   Laren Everts MD on 05/22/2010   Method used:   Telephoned to ...       [id number] (retail)       [address]       [place], [place]  [address]       Ph: [phone] or [phone]       Fax: [phone]   RxID:   [id number]

## 2011-01-12 NOTE — Progress Notes (Signed)
Summary: prior authorization/gp  Phone Note Outgoing Call   Summary of Call: Prior Authorization form needs to be completed/signed for Zolpidem Tartrate.  Form is in your box at the clinic.  The last PA was for 30 days only.  Thanks Initial call taken by: Chinita Pester RN,  January 01, 2010 12:01 PM  Follow-up for Phone Call        Completed form faxed;waiting on a response. Follow-up by: Chinita Pester RN,  January 05, 2010 9:05 AM

## 2011-01-12 NOTE — Progress Notes (Signed)
Summary: med refill/gp  Phone Note Refill Request Message from:  Patient on Apr 20, 2010 4:11 PM  Refills Requested: Medication #1:  [facility] OMEPRAZOLE 20 MG  TBEC Take 1 tablet by mouth two times a day  Medication #2:  VICODIN 5-500 MG TABS Take 1 tablet by mouth four times a day  as needed  Method Requested: Telephone to Pharmacy Initial call taken by: Chinita Pester RN,  Apr 20, 2010 4:11 PM    Prescriptions: VICODIN 5-500 MG TABS (HYDROCODONE-ACETAMINOPHEN) Take 1 tablet by mouth four times a day  as needed  #60 x 0   Entered and Authorized by:   Laren Everts MD   Signed by:   Laren Everts MD on 04/20/2010   Method used:   Telephoned to ...       [id number] (retail)       [address]       [place], [place]  [address]       Ph: [phone] or [phone]       Fax: [phone]   RxID:   [id number] [facility] OMEPRAZ[facility] 20 MG  TBEC (OMEPRAZOLE) Take 1 tablet by mouth two times a day  #60 Capsule x 5   Entered and Authorized by:   Laren Everts MD   Signed by:   Laren Everts MD on 04/20/2010   Method used:   Electronically to        [id number] (retail)       [address]       [place], [place]  [address]       Ph: [phone] or [phone]       Fax: [phone]   RxID:   [id number]   Appended Document: med refill/gp Vicodin [facility] [address]
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
## 2011-01-12 NOTE — Progress Notes (Signed)
Summary: refill/ hla  Phone Note Refill Request Message from:  Fax from Pharmacy on May 06, 2010 11:31 AM  Refills Requested: Medication #1:  VICODIN 5-500 MG TABS Take 1 tablet by mouth four times a day  as needed   Dosage confirmed as above?Dosage Confirmed   Last Refilled: 5/10 not due til 6/10  Initial call taken by: Marin Roberts RN,  May 06, 2010 11:31 AM    Will be refilled on 6/10.

## 2011-01-12 NOTE — Progress Notes (Signed)
Summary: Refill/gh  Phone Note Refill Request Message from:  Fax from Pharmacy on July 17, 2010 4:31 PM  Refills Requested: Medication #1:  VICODIN 5-500 MG TABS Take 1 tablet by mouth four times a day  as needed   Last Refilled: 04/21/2010  Method Requested: Electronic Initial call taken by: Angelina Ok RN,  July 17, 2010 4:31 PM    Prescriptions: VICODIN 5-500 MG TABS (HYDROCODONE-ACETAMINOPHEN) Take 1 tablet by mouth four times a day  as needed  #60 x 0   Entered and Authorized by:   Laren Everts MD   Signed by:   Laren Everts MD on 07/17/2010   Method used:   Telephoned to ...       [id number] (retail)       [address]       [place], [place]  [address]       Ph: [phone] or [phone]       Fax: [phone]   RxID:   [id number]

## 2011-01-12 NOTE — Progress Notes (Signed)
Summary: Refill/gh  Phone Note Refill Request Message from:  Fax from Pharmacy on September 15, 2010 10:11 AM  Refills Requested: Medication #1:  VICODIN 5-500 MG TABS Take 1 tablet by mouth four times a day  as needed   Last Refilled: 08/15/2010  Method Requested: Electronic Initial call taken by: Angelina Ok RN,  September 15, 2010 10:11 AM    Prescriptions: VICODIN 5-500 MG TABS (HYDROCODONE-ACETAMINOPHEN) Take 1 tablet by mouth four times a day  as needed  #60 x 0   Entered and Authorized by:   Laren Everts MD   Signed by:   Laren Everts MD on 09/15/2010   Method used:   Telephoned to ...       [id number] (retail)       [address]       [place], [place]  [address]       Ph: [phone] or [phone]       Fax: [phone]   RxID:   [id number]

## 2011-01-12 NOTE — Progress Notes (Signed)
Summary: Refill/gh  Phone Note Refill Request Message from:  Fax from Pharmacy on October 16, 2010 3:56 PM  Refills Requested: Medication #1:  ZOLOFT 50 MG TABS Take 2 tablets by mouth once a day   Last Refilled: 09/24/2010  Method Requested: Electronic Initial call taken by: Angelina Ok RN,  October 16, 2010 3:57 PM    New/Updated Medications: ZOLOFT 100 MG TABS (SERTRALINE HCL) Take 1 tablet by mouth once a day Prescriptions: ZOLOFT 100 MG TABS (SERTRALINE HCL) Take 1 tablet by mouth once a day  #30 x 3   Entered and Authorized by:   Laren Everts MD   Signed by:   Laren Everts MD on 10/17/2010   Method used:   Electronically to        [id number] (retail)       [address]       [place], [place]  [address]       Ph: [phone] or [phone]       Fax: [phone]   RxID:   [id number]

## 2011-01-12 NOTE — Progress Notes (Signed)
Summary: med refill/gp  Phone Note Refill Request Message from:  Fax from Pharmacy on November 11, 2010 10:55 AM  Refills Requested: Medication #1:  AMBIEN 5 MG  TABS Take 1 tablet at bedtime as needed for sleep.  Do not take Benadryl while taking Ambien. Has an appt.  in Jan.   Method Requested: Telephone to Pharmacy Initial call taken by: Chinita Pester RN,  November 11, 2010 10:55 AM    Prescriptions: AMBIEN 5 MG  TABS (ZOLPIDEM TARTRATE) Take 1 tablet at bedtime as needed for sleep.  Do not take Benadryl while taking Ambien.  #30 x 1   Entered and Authorized by:   Laren Everts MD   Signed by:   Laren Everts MD on 11/12/2010   Method used:   Telephoned to ...       [id number] (retail)       [address]       [place], [place]  [address]       Ph: [phone] or [phone]       Fax: [phone]   RxID:   [id number]   Appended Document: med refill/gp Ambien [facility]

## 2011-01-12 NOTE — Progress Notes (Signed)
Summary: refill/gg  Phone Note Refill Request  on May 25, 2010 5:01 PM  Refills Requested: Medication #1:  VICODIN 5-500 MG TABS Take 1 tablet by mouth four times a day  as needed   Last Refilled: 04/20/2010  Method Requested: Telephone to Pharmacy Initial call taken by: Merrie Roof RN,  May 25, 2010 5:02 PM    Prescriptions: VICODIN 5-500 MG TABS (HYDROCODONE-ACETAMINOPHEN) Take 1 tablet by mouth four times a day  as needed  #60 x 1   Entered and Authorized by:   Laren Everts MD   Signed by:   Laren Everts MD on 05/26/2010   Method used:   Telephoned to ...       [id number] (retail)       [address]       [place], [place]  [address]       Ph: [phone] or [phone]       Fax: [phone]   RxID:   [id number]   Appended Document: refill/gg Rx called in

## 2011-01-12 NOTE — Progress Notes (Signed)
Summary: refill/gg  Phone Note Refill Request  on February 25, 2010 2:29 PM  Refills Requested: Medication #1:  ZOLOFT 50 MG TABS Take 1 tablet by mouth once a day Initial call taken by: Merrie Roof RN,  February 25, 2010 2:30 PM    Prescriptions: ZOLOFT 50 MG TABS (SERTRALINE HCL) Take 1 tablet by mouth once a day  #30 x 3   Entered and Authorized by:   Laren Everts MD   Signed by:   Laren Everts MD on 02/25/2010   Method used:   Electronically to        [id number] (retail)       [address]       [place], [place]  [address]       Ph: [phone] or [phone]       Fax: [phone]   RxID:   [id number]

## 2011-01-12 NOTE — Progress Notes (Signed)
Summary: med refill/gp  Phone Note Refill Request Message from:  Fax from Pharmacy on October 06, 2010 1:51 PM  Refills Requested: Medication #1:  MOBIC 15 MG TABS Take one tablet by mouth once a day.   Last Refilled: 09/01/2010 Last appt. Oct 6.   Method Requested: Electronic Initial call taken by: Chinita Pester RN,  October 06, 2010 1:50 PM    Prescriptions: MOBIC 15 MG TABS (MELOXICAM) Take one tablet by mouth once a day.  #30 x 0   Entered and Authorized by:   Laren Everts MD   Signed by:   Laren Everts MD on 10/07/2010   Method used:   Electronically to        [id number] (retail)       [address]       [place], [place]  [address]       Ph: [phone] or [phone]       Fax: [phone]   RxID:   [id number]

## 2011-01-12 NOTE — Assessment & Plan Note (Signed)
Summary: ACUTE/VEGA/ACID REFLUX AND SWELLING IN HANDS/CH   Vital Signs:  Patient profile:   60 year old female Height:      63 inches (160.02 cm) Weight:      183.04 pounds (83.20 kg) BMI:     32.54 Temp:     98.7 degrees F (37.06 degrees C) oral Pulse rate:   94 / minute BP sitting:   133 / 81  (left arm)  Vitals Entered By: Angelina Ok RN (September 01, 2010 11:22 AM) CC: Depression Is Patient Diabetic? No Pain Assessment Patient in pain? yes     Location: ankles, feet, knees, hips, ankles Intensity: 6 Type: aching Onset of pain  Constant Nutritional Status BMI of > 30 = obese  Have you ever been in a relationship where you felt threatened, hurt or afraid?No   Does patient need assistance? Functional Status Self care Ambulation Impaired:Risk for fall Comments Uses a cane to amulate.  Check up.  Needs refills on Ambien , Zoloft.  Wants to change Acid refflux xmed not working.  On Humeria from the Dermatologist.   Primary Care Nareh Matzke:  Laren Everts MD  CC:  Depression.  History of Present Illness: This is 60 year old female with PMH including Depression and GERD who presents to clinic with hand pain.  1. Hand, bilateral:  pain, numbness and tingling, stiffness and swelling, weakness ( drops glasses if the pain is really worth),   it started  6- 9 month ago and it is getting progressively worth.  Pain is present all the the time but it is aggreviated by movement worst in the evening but there is no alleviating factors. Has used Aleve and Hydrocodon with no relieve.  Patient denies stiffness in the morning, joint tenderness, swelling or redness.  2. Depression: improved.    3. Weight gain: since she stopped smoking  3 yeas ago   4. Med rec. patient did not bring her medication with he. She was not sure  of all her med she was taking.    Depression History:      The patient is having a depressed mood most of the day but denies diminished interest in her  usual daily activities.        Comments:  On Zoloft.  Has anxieewty that keeps her awake.   Preventive Screening-Counseling & Management  Alcohol-Tobacco     Smoking Status: quit     Smoking Cessation Counseling: yes     Packs/Day: 1     Year Quit: 2009  Review of Systems General:  Complains of fatigue and sweats; denies fever, loss of appetite, malaise, and sleep disorder. Eyes:  Denies blurring and eye irritation. CV:  Denies chest pain or discomfort, lightheadness, and shortness of breath with exertion. Resp:  Denies cough and shortness of breath. GI:  Denies abdominal pain, excessive appetite, nausea, and vomiting. GU:  Denies dysuria and incontinence. MS:  hands: weakness, numbness but no joint pain, redness, stiffness in the morning or swelling. Derm:  Denies changes in color of skin and flushing. Neuro:  Complains of numbness and weakness; denies disturbances in coordination, headaches, and tremors; in hands bilaterally. Psych:  Complains of depression; denies easily tearful, irritability, and suicidal thoughts/plans; ocassionaly but improved. Marland Kitchen  Physical Exam  General:  Well-developed,well-nourished,in no acute distress; alert,appropriate and cooperative throughout examination Head:  Normocephalic and atraumatic without obvious abnormalities. No apparent alopecia or balding. Eyes:  No corneal or conjunctival inflammation noted. EOMI. Perrla.  Vision grossly normal. Neck:  No deformities, masses, or tenderness noted. Lungs:  Normal respiratory effort, chest expands symmetrically. Lungs are clear to auscultation, no crackles or wheezes. Heart:  Normal rate and regular rhythm. S1 and S2 normal without gallop, murmur, click, rub or other extra sounds. Abdomen:  Bowel sounds positive,abdomen soft and non-tender without masses, organomegaly or hernias noted. Msk:  normal ROM, no joint tenderness, no joint swelling, no joint warmth, no redness over joints, no joint deformities, no  joint instability, and no crepitation.   Extremities:  Hands: normal ROM, no joint tenderness, no joint swelling, no joint warmth, no redness over joints, no joint deformities, no joint instability, and no crepitation.  Lower extremities: No clubbing, no cyanosis no edema.  Neurologic:  No cranial nerve deficits noted. StationPlantar reflexes are down-going bilaterally. DTRs are symmetrical throughout. Sensory, motor and coordinative functions appear intact. Skin:  Intact without suspicious lesions or rashes Cervical Nodes:  No lymphadenopathy noted Psych:  Cognition and judgment appear intact. Alert and cooperative with normal attention span and concentration. No apparent delusions, illusions, hallucinations   Impression & Recommendations:  Problem # 1:  HAND PAIN, BILATERAL (ICD-729.5) Patient presented with bilateral hand pain. DD is wide including Thyroid dysfunction, RA, Syphillis,  Vitamin B12 deficiency since patient has low levels in the past. Will check Labs. Patient advised to continue on current pain meds including vicodin and follow up in 2 weeks for possible changes in management after receiving the lab results. If lab results are normal consider nerve conduction study.   Orders: T- * Misc. Laboratory test [phone]) T-Comprehensive Metabolic Panel [id number]) T-CBC w/Diff [id number]) T-TSH [id number]) Jackie Plum [phone]) T-Rheumatoid Factor [id number]) T-Syphilis Test (RPR) [id number]) T-Sed Rate (Automated) [phone])  Problem # 2:  DEPRESSION (ICD-311) Patient noted that her symptoms improved but was wondering if Zoloft is contributing to her weight gain since her appetite is very good.  I discussed that indeed Zoloft can increase appetite and we  may consider to change medication at the next visit.   Her updated medication list for this problem includes:    Zoloft 50 Mg Tabs (Sertraline hcl) .Marland Kitchen... Take 1 tablet by mouth once a day  Problem # 3:  WEIGHT GAIN  (ICD-783.1) Weight increase of 10 lb since 2009. Smoking cessation,Zoloft, increased appetite and no exercise could contribute to pt's weight gain. Considered to readdress at the next visit with PCP.    Complete Medication List: 1)  Nexium 20 Mg Pack (Esomeprazole magnesium) .... Take one tablet by mouth two times a day 2)  Vicodin 5-500 Mg Tabs (Hydrocodone-acetaminophen) .... Take 1 tablet by mouth four times a day  as needed 3)  Ambien 5 Mg Tabs (Zolpidem tartrate) .... Take 1 tablet at bedtime as needed for sleep.  do not take benadryl while taking ambien. 4)  Benadryl 25 Mg Caps (Diphenhydramine hcl) .... Take one pill at bedtime to help with insomnia 5)  Colace 100 Mg Caps (Docusate sodium) .... Take 1 tablet by mouth two times a day 6)  Ferrous Sulfate 325 (65 Fe) Mg Tabs (Ferrous sulfate) .... Take 1 tablet by mouth three times a day 7)  Miralax Powd (Polyethylene glycol 3350) .... Take 17 grams by mouth one time per day.  may mix with water or juice 8)  Vitamin C-acerola 500 Mg Chew (Ascorbic acid) .... Take 1 tablet by mouth once a day 9)  Fish Oil Concentrate 1000 Mg Caps (Omega-3 fatty acids) .... Take 1 tablet by mouth once a day 10)  Glucosamine-chondroitin 1500-1200  Mg/98ml Liqd (Glucosamine-chondroitin) .... Take 1 tablet by mouth once a day 11)  Zoloft 50 Mg Tabs (Sertraline hcl) .... Take 1 tablet by mouth once a day 12)  Hydrocortisone 1 % Crea (Hydrocortisone) .... Apply thin layer of cream twice a day 13)  Pravachol 20 Mg Tabs (Pravastatin sodium) .... Take 1 tablet by mouth once a day 14)  Fluticasone Propionate 50 Mcg/act Susp (Fluticasone propionate) .... Give 1 spray in each nostril two times a day 15)  Azithromycin 250 Mg Tabs (Azithromycin) .... Take 2 pills by mouth on the first day, then 1 pill by mouth once daily for 4 more days 16)  Mobic 15 Mg Tabs (Meloxicam) .... Take one tablet by mouth once a day.  Patient Instructions: 1)  F/U in 2  weeks Prescriptions: MOBIC 15 MG TABS (MELOXICAM) Take one tablet by mouth once a day.  #30 x 0   Entered and Authorized by:   Almyra Deforest MD   Signed by:   Almyra Deforest MD on 09/01/2010   Method used:   Print then Give to Patient   RxID:   [id number] NEXIUM 20 MG PACK (ESOMEPRAZOLE MAGNESIUM) Take one tablet by mouth two times a day  #60 x 2   Entered and Authorized by:   Almyra Deforest MD   Signed by:   Almyra Deforest MD on 09/01/2010   Method used:   Print then Give to Patient   RxID:   [id number]   Prevention & Chronic Care Immunizations   Influenza vaccine: Fluvax 3+  (11/25/2009)    Tetanus booster: Not documented    Pneumococcal vaccine: Not documented  Colorectal Screening   Hemoccult: Not documented    Colonoscopy: Not documented  Other Screening   Pap smear: Not documented    Mammogram: BI-RADS CATEGORY 2:  Benign finding(s).^MM DIGITAL DIAG LTD R  (08/29/2009)   Smoking status: quit  (09/01/2010)  Lipids   Total Cholesterol: 298  (08/21/2009)   Lipid panel action/deferral: Lipid Panel ordered   LDL: 202  (08/21/2009)   LDL Direct: Not documented   HDL: 49  (08/21/2009)   Triglycerides: 233  (08/21/2009)   Process Orders Check Orders Results:     [facility]: Order checked:     [id number] -- T- * Misc. Laboratory test -- No ABN rules found (CPT: ) Order queued for requisitioning for [facility]: September 01, 2010 1:37 PM  Tests Sent for requisitioning (September 04, 2010 8:19 AM):     09/01/2010: [facility] -- T- * Misc. Laboratory test [[id number]] (signed)     09/01/2010: [facility] -- T-Comprehensive Metabolic Panel [[id number]] (signed)     09/01/2010: [facility] -- T-CBC w/Diff [[id number]] (signed)     09/01/2010: [facility] -- T-TSH [id number] (signed)     09/01/2010: [facility] -- Jackie Plum [[id number]] (signed)     09/01/2010: [facility] -- T-Rheumatoid Factor [id number] (signed)     09/01/2010: [facility] -- T-Syphilis Test (RPR) [id number] (signed)     09/01/2010: [facility] -- T-Sed Rate (Automated) [id number] (signed)

## 2011-01-14 NOTE — Progress Notes (Signed)
Summary: med refill/gp  Phone Note Refill Request Message from:  Fax from Pharmacy on December 21, 2010 11:46 AM  Refills Requested: Medication #1:  MOBIC 15 MG TABS Take one tablet by mouth once a day.   Last Refilled: 11/17/2010 Last appt. 11/02/10.   Method Requested: Electronic Initial call taken by: Chinita Pester RN,  December 21, 2010 11:47 AM  Follow-up for Phone Call        Has appt later this month. Follow-up by: Blanch Media MD,  December 21, 2010 1:38 PM    Prescriptions: MOBIC 15 MG TABS (MELOXICAM) Take one tablet by mouth once a day.  #30 x 1   Entered and Authorized by:   Blanch Media MD   Signed by:   Blanch Media MD on 12/21/2010   Method used:   Electronically to        [id number] (retail)       [address]       [place], [place]  [address]       Ph: [phone] or [phone]       Fax: [phone]   RxID:   [id number]

## 2011-01-14 NOTE — Consult Note (Signed)
Summary: [facility]   Imported By: Louretta Parma 11/11/2010 12:09:01  _____________________________________________________________________  External Attachment:    Type:   Image     Comment:   External Document

## 2011-01-14 NOTE — Progress Notes (Signed)
Summary: Refill/gh  Phone Note Refill Request Message from:  Fax from Pharmacy on January 06, 2011 4:04 PM  Refills Requested: Medication #1:  NEXIUM 20 MG PACK Take one tablet by mouth two times a day   Last Refilled: 11/18/2010 Last office visit was 11/02/2010.   Method Requested: Electronic Initial call taken by: Angelina Ok RN,  January 06, 2011 4:04 PM    Prescriptions: NEXIUM 20 MG PACK (ESOMEPRAZOLE MAGNESIUM) Take one tablet by mouth two times a day  #60 x 4   Entered and Authorized by:   Donia Guiles MD   Signed by:   Donia Guiles MD on 01/06/2011   Method used:   Electronically to        [id number] (retail)       [address]       [place], [place]  [address]       Ph: [phone] or [phone]       Fax: [phone]   RxID:   [id number]

## 2011-01-20 NOTE — Progress Notes (Signed)
Summary: Refill/gh  Phone Note Refill Request Message from:  Patient on January 11, 2011 2:14 PM  Refills Requested: Medication #1:  AMBIEN 5 MG  TABS Take 1 tablet at bedtime as needed for sleep.  Do not take Benadryl while taking Ambien. Last office visit was 11/02/2010.  Last labs were 09/01/2010.   Method Requested: Electronic Initial call taken by: Angelina Ok RN,  January 11, 2011 2:14 PM  Follow-up for Phone Call        Refill approved-nurse to complete Follow-up by: Ulyess Mort MD,  January 11, 2011 2:26 PM  Additional Follow-up for Phone Call Additional follow up Details #1::        Rx faxed to pharmacy Additional Follow-up by: Merrie Roof RN,  January 12, 2011 5:21 PM    Prescriptions: AMBIEN 5 MG  TABS (ZOLPIDEM TARTRATE) Take 1 tablet at bedtime as needed for sleep.  Do not take Benadryl while taking Ambien.  #30 x 1   Entered and Authorized by:   Ulyess Mort MD   Signed by:   Ulyess Mort MD on 01/11/2011   Method used:   Telephoned to ...       [id number] (retail)       [address]       [place], [place]  [address]       Ph: [phone] or [phone]       Fax: [phone]   RxID:   [id number]

## 2011-01-21 ENCOUNTER — Other Ambulatory Visit: Payer: Self-pay | Admitting: Internal Medicine

## 2011-02-05 ENCOUNTER — Encounter: Payer: Self-pay | Admitting: Orthopedic Surgery

## 2011-02-11 ENCOUNTER — Encounter: Payer: Self-pay | Admitting: Orthopedic Surgery

## 2011-02-16 ENCOUNTER — Ambulatory Visit (INDEPENDENT_AMBULATORY_CARE_PROVIDER_SITE_OTHER): Payer: Medicare Other | Admitting: Internal Medicine

## 2011-02-16 ENCOUNTER — Encounter: Payer: Self-pay | Admitting: Internal Medicine

## 2011-02-16 DIAGNOSIS — D509 Iron deficiency anemia, unspecified: Secondary | ICD-10-CM

## 2011-02-16 DIAGNOSIS — G5603 Carpal tunnel syndrome, bilateral upper limbs: Secondary | ICD-10-CM | POA: Insufficient documentation

## 2011-02-16 DIAGNOSIS — F3289 Other specified depressive episodes: Secondary | ICD-10-CM

## 2011-02-16 DIAGNOSIS — I10 Essential (primary) hypertension: Secondary | ICD-10-CM

## 2011-02-16 DIAGNOSIS — G5600 Carpal tunnel syndrome, unspecified upper limb: Secondary | ICD-10-CM

## 2011-02-16 DIAGNOSIS — R232 Flushing: Secondary | ICD-10-CM | POA: Insufficient documentation

## 2011-02-16 DIAGNOSIS — G609 Hereditary and idiopathic neuropathy, unspecified: Secondary | ICD-10-CM

## 2011-02-16 DIAGNOSIS — N951 Menopausal and female climacteric states: Secondary | ICD-10-CM

## 2011-02-16 DIAGNOSIS — K5909 Other constipation: Secondary | ICD-10-CM

## 2011-02-16 DIAGNOSIS — G4700 Insomnia, unspecified: Secondary | ICD-10-CM

## 2011-02-16 DIAGNOSIS — F329 Major depressive disorder, single episode, unspecified: Secondary | ICD-10-CM

## 2011-02-16 DIAGNOSIS — E785 Hyperlipidemia, unspecified: Secondary | ICD-10-CM

## 2011-02-16 LAB — CBC
HCT: 33.5 % — ABNORMAL LOW (ref 36.0–46.0)
MCH: 23.1 pg — ABNORMAL LOW (ref 26.0–34.0)
MCHC: 29.3 g/dL — ABNORMAL LOW (ref 30.0–36.0)
MCV: 79 fL (ref 78.0–100.0)
Platelets: 295 10*3/uL (ref 150–400)
RDW: 15.9 % — ABNORMAL HIGH (ref 11.5–15.5)
WBC: 5.1 10*3/uL (ref 4.0–10.5)

## 2011-02-16 LAB — BASIC METABOLIC PANEL
BUN: 15 mg/dL (ref 6–23)
Calcium: 9.3 mg/dL (ref 8.4–10.5)
Chloride: 105 mEq/L (ref 96–112)
Creat: 0.72 mg/dL (ref 0.40–1.20)

## 2011-02-16 MED ORDER — CHLORTHALIDONE 15 MG PO TABS: 15.0000 mg | ORAL_TABLET | Freq: Every day | ORAL | Status: DC

## 2011-02-16 MED ORDER — PRAVASTATIN SODIUM 40 MG PO TABS: 40.0000 mg | ORAL_TABLET | Freq: Every day | ORAL | Status: AC

## 2011-02-16 MED ORDER — SERTRALINE HCL 100 MG PO TABS: 50.0000 mg | ORAL_TABLET | Freq: Every day | ORAL | Status: AC

## 2011-02-16 MED ORDER — SENNOSIDES 8.6 MG PO TABS: 2.0000 | ORAL_TABLET | Freq: Two times a day (BID) | ORAL | Status: AC

## 2011-02-16 MED ORDER — GABAPENTIN 100 MG PO CAPS: 100.0000 mg | ORAL_CAPSULE | Freq: Three times a day (TID) | ORAL | Status: DC

## 2011-02-16 NOTE — Assessment & Plan Note (Signed)
Stable. Continue Zoloft at current dose.

## 2011-02-16 NOTE — Patient Instructions (Addendum)
Make follow up appointment in 1 month. Start taking chlorthalidone for high blood pressure. Remember to restart taking iron supplementation, and to start taking 40mg  Pravastatin daily. You will be called with any abnormalities in the tests scheduled or performed today.  If you don't hear from [place] within a week from when the test was performed, you can assume that your test was normal.

## 2011-02-16 NOTE — Assessment & Plan Note (Addendum)
Stable. Ferritin 9 10/11. Patient instructed to restart taking iron supplementation. Patient given senokot for constipation. Check CBC and follow up.

## 2011-02-16 NOTE — Assessment & Plan Note (Signed)
Improved. S/p decompression surgery of left wrist. Patient to follow up with Dr. Orlan Leavens. Will get records.
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
## 2011-02-16 NOTE — Assessment & Plan Note (Signed)
Uncontrolled. Instructed to start taking neurontin which may help symptoms. Will follow up.

## 2011-02-16 NOTE — Assessment & Plan Note (Addendum)
Above goal <130. Will increase pravastatin to 40mg . Recheck FLP and cmet in 2 months.

## 2011-02-16 NOTE — Assessment & Plan Note (Signed)
New diagnosis. Patient will be started on low dose chlorthalidone. Check bmet now and in 4-6 weeks.

## 2011-03-02 ENCOUNTER — Other Ambulatory Visit: Payer: Self-pay | Admitting: Family Medicine

## 2011-03-02 DIAGNOSIS — Z09 Encounter for follow-up examination after completed treatment for conditions other than malignant neoplasm: Secondary | ICD-10-CM

## 2011-03-03 LAB — CULTURE, ROUTINE-ABSCESS

## 2011-03-10 ENCOUNTER — Ambulatory Visit (HOSPITAL_COMMUNITY)
Admission: RE | Admit: 2011-03-10 | Discharge: 2011-03-10 | Disposition: A | Discharge status: Home / Self Care | Payer: Medicare Other | Source: Ambulatory Visit | Attending: Internal Medicine | Admitting: Internal Medicine

## 2011-03-10 ENCOUNTER — Encounter: Payer: Self-pay | Admitting: Internal Medicine

## 2011-03-10 ENCOUNTER — Ambulatory Visit (INDEPENDENT_AMBULATORY_CARE_PROVIDER_SITE_OTHER): Payer: Medicare Other | Admitting: Internal Medicine

## 2011-03-10 VITALS — BP 122/74 | HR 80 | Temp 97.8°F | Wt 184.2 lb

## 2011-03-10 DIAGNOSIS — N951 Menopausal and female climacteric states: Secondary | ICD-10-CM

## 2011-03-10 DIAGNOSIS — G5603 Carpal tunnel syndrome, bilateral upper limbs: Secondary | ICD-10-CM

## 2011-03-10 DIAGNOSIS — D509 Iron deficiency anemia, unspecified: Secondary | ICD-10-CM

## 2011-03-10 DIAGNOSIS — E876 Hypokalemia: Secondary | ICD-10-CM | POA: Insufficient documentation

## 2011-03-10 DIAGNOSIS — R9431 Abnormal electrocardiogram [ECG] [EKG]: Secondary | ICD-10-CM | POA: Insufficient documentation

## 2011-03-10 DIAGNOSIS — G5600 Carpal tunnel syndrome, unspecified upper limb: Secondary | ICD-10-CM

## 2011-03-10 DIAGNOSIS — R232 Flushing: Secondary | ICD-10-CM

## 2011-03-10 DIAGNOSIS — G609 Hereditary and idiopathic neuropathy, unspecified: Secondary | ICD-10-CM

## 2011-03-10 DIAGNOSIS — K219 Gastro-esophageal reflux disease without esophagitis: Secondary | ICD-10-CM

## 2011-03-10 DIAGNOSIS — G4700 Insomnia, unspecified: Secondary | ICD-10-CM

## 2011-03-10 DIAGNOSIS — I10 Essential (primary) hypertension: Secondary | ICD-10-CM

## 2011-03-10 LAB — BASIC METABOLIC PANEL
CO2: 27 mEq/L (ref 19–32)
Chloride: 101 mEq/L (ref 96–112)
Potassium: 2.8 mEq/L — ABNORMAL LOW (ref 3.5–5.3)
Sodium: 148 mEq/L — ABNORMAL HIGH (ref 135–145)

## 2011-03-10 MED ORDER — POTASSIUM CHLORIDE CRYS ER 20 MEQ PO TBCR: 20.0000 meq | EXTENDED_RELEASE_TABLET | Freq: Every day | ORAL | Status: DC

## 2011-03-10 MED ORDER — POTASSIUM CHLORIDE CRYS ER 20 MEQ PO TBCR
40.0000 meq | EXTENDED_RELEASE_TABLET | Freq: Once | ORAL | Status: AC
  Administered 2011-03-10: 40 meq via ORAL

## 2011-03-10 MED ORDER — BENZONATATE 100 MG PO CAPS: ORAL_CAPSULE | ORAL | Status: DC

## 2011-03-10 MED ORDER — POTASSIUM CHLORIDE CRYS ER 20 MEQ PO TBCR: 20.0000 meq | EXTENDED_RELEASE_TABLET | Freq: Three times a day (TID) | ORAL | Status: AC

## 2011-03-10 MED ORDER — GABAPENTIN 100 MG PO CAPS: 300.0000 mg | ORAL_CAPSULE | Freq: Every day | ORAL | Status: AC

## 2011-03-10 MED ORDER — ZOLPIDEM TARTRATE 5 MG PO TABS: 5.0000 mg | ORAL_TABLET | Freq: Every evening | ORAL | Status: DC | PRN

## 2011-03-10 MED ORDER — CHLORTHALIDONE 15 MG PO TABS: 15.0000 mg | ORAL_TABLET | Freq: Every day | ORAL | Status: DC

## 2011-03-10 MED ORDER — ESOMEPRAZOLE MAGNESIUM 20 MG PO CPDR: 40.0000 mg | DELAYED_RELEASE_CAPSULE | Freq: Two times a day (BID) | ORAL | Status: DC

## 2011-03-10 NOTE — Assessment & Plan Note (Signed)
Stable  Continue current regimen  

## 2011-03-10 NOTE — Assessment & Plan Note (Addendum)
Improved. May consider increasing neurontin to 300mg  po tid if needed to help further control hot flashes on follow up.

## 2011-03-10 NOTE — Assessment & Plan Note (Addendum)
Most likely etiology is PUD from NSAIDs. Patient was token off NSAIDs last visit. PPI was increased. Patient denies any melena, hemotachezia. Taking iron supplementation.  Recheck CBC on follow up.

## 2011-03-10 NOTE — Patient Instructions (Signed)
Make a follow up appointment in 1 month. Take all medication as directed. 

## 2011-03-10 NOTE — Assessment & Plan Note (Signed)
S/p decompression surgery of right wrist. Patient has now had surgery on both wrist.
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
## 2011-03-10 NOTE — Assessment & Plan Note (Signed)
2/2 to chlorthalidone. Repeated EKG in clinic> No U waves, RBBB, no changes from EKG on 03/01/11. Patient was given 40 meq Potassium chloride in clinic. Follow up on bmet today. Instructed to start taking 20 meq KCL tid. Will recheck bmet in two weeks.

## 2011-03-10 NOTE — Assessment & Plan Note (Addendum)
Good control on current regimen. Recheck bmet in  2 weeks. If continues hypokalemic on follow up will consider adding triamterene.

## 2011-03-11 ENCOUNTER — Other Ambulatory Visit: Payer: Self-pay | Admitting: Internal Medicine

## 2011-03-11 ENCOUNTER — Other Ambulatory Visit (INDEPENDENT_AMBULATORY_CARE_PROVIDER_SITE_OTHER): Payer: Medicare Other | Admitting: *Deleted

## 2011-03-11 DIAGNOSIS — I10 Essential (primary) hypertension: Secondary | ICD-10-CM

## 2011-03-11 MED ORDER — CHLORTHALIDONE 15 MG PO TABS: 15.0000 mg | ORAL_TABLET | Freq: Every day | ORAL | Status: DC

## 2011-03-11 MED ORDER — BENZONATATE 100 MG PO CAPS: ORAL_CAPSULE | ORAL | Status: DC

## 2011-03-11 MED ORDER — HYDROCHLOROTHIAZIDE 12.5 MG PO TABS: 12.5000 mg | ORAL_TABLET | Freq: Every day | ORAL | Status: AC

## 2011-03-11 MED ORDER — DEXTROMETHORPHAN HBR 10 MG/5ML PO LIQD: 5.0000 mL | Freq: Four times a day (QID) | ORAL | Status: AC | PRN

## 2011-03-11 NOTE — Assessment & Plan Note (Signed)
Insurance not covering chlorthalidone will change to HCTZ 12.5 mg po daily.

## 2011-03-11 NOTE — Telephone Encounter (Signed)
Will change medication

## 2011-03-11 NOTE — Telephone Encounter (Signed)
Call from pt her insurance will not cover either of these meds would like something different if possible.

## 2011-03-30 LAB — CBC
HCT: 32.3 % — ABNORMAL LOW (ref 36.0–46.0)
MCHC: 31.9 g/dL (ref 30.0–36.0)
MCV: 78.8 fL (ref 78.0–100.0)
Platelets: 289 10*3/uL (ref 150–400)
RDW: 15.1 % (ref 11.5–15.5)
RDW: 15.8 % — ABNORMAL HIGH (ref 11.5–15.5)

## 2011-03-30 LAB — TYPE AND SCREEN: ABO/RH(D): A POS

## 2011-04-02 ENCOUNTER — Telehealth: Payer: Self-pay | Admitting: Internal Medicine

## 2011-04-02 NOTE — Telephone Encounter (Signed)
Attempted to contact patient X2 at [phone]. No message left. Questions re: Zoloft.

## 2011-04-09 ENCOUNTER — Ambulatory Visit: Payer: Medicare Other | Admitting: Internal Medicine

## 2011-04-22 ENCOUNTER — Encounter: Payer: Self-pay | Admitting: Internal Medicine

## 2011-04-27 NOTE — Discharge Summary (Signed)
[name], [name]               ACCOUNT NO.:  [id number]   MEDICAL RECORD NO.:  [id number]          PATIENT TYPE:  INP   LOCATION:  9309                          FACILITY:  [facility]   PHYSICIAN:  Allie Bossier, MD        DATE OF BIRTH:  1950/12/25   DATE OF ADMISSION:  01/21/2009  DATE OF DISCHARGE:  01/22/2009                               DISCHARGE SUMMARY   ADMISSION DIAGNOSES:  Postmenopausal bleeding, left adnexal cyst,  genuine stress urinary incontinence, and anemia.   DISCHARGE DIAGNOSES:  Postmenopausal bleeding, left adnexal cyst,  genuine stress urinary incontinence, endometriosis, and anemia.   CONDITION:  Stable.   DISPOSITION:  Home.   DIET:  As tolerated.   ACTIVITY:  As tolerated.   She should follow up in 6 weeks or as necessary sooner.   MEDICATIONS:  Prescribed Vicodin 1 p.o. q.4 h. p.r.n. pain #30, no  refills.  Over-the-counter recommendation is iron 325 mg p.o. daily and  Colace 1 p.o. daily.   PROCEDURES:  During hospital stay was a total vaginal hysterectomy,  tension-free vaginal tape, laparoscopy, and bilateral salpingo-  oophorectomy.   HISTORY OF PRESENT ILLNESS:  On hospital course, [name] is a 60-year-  old lady who was having a history of postmenopausal bleeding.  An  ultrasound showed a 3-mm endometrium, but also a left ovarian cyst  measuring 2.9 cm.  Her CA-125 was normal.  She also complained of  genuine stress urinary incontinence.  She wishes to have surgical  removal of her uterus, ovaries, tubes as well as a sling procedure to  alleviate her incontinence.  She underwent the above-listed surgeries  with approximately 200 mL of estimated blood loss.  Her preop hemoglobin  was 10.3 and postop was 7.7.  By postoperative day #1, her only  complaint was that she was starving.  She had her physical exam, which  showed a normal abdomen, nondistended, nontender.  She had  normoactive bowel sounds.  Her incisions were clean, dry and intact,  and  she was ready to go home by that afternoon and she will follow up as  above.  Please note her final pathology showed endometriosis of the  right fallopian tube.  The remainder of her pathology was within normal  limits.       Allie Bossier, MD  Electronically Signed     MCD/MEDQ  D:  02/23/2009  T:  02/23/2009  Job:  [id number]
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
## 2011-04-27 NOTE — Group Therapy Note (Signed)
[name], [name] NO.:  [id number]   MEDICAL RECORD NO.:  [id number]          PATIENT TYPE:  WOC   LOCATION:  [facility]                   FACILITY:  [facility]   PHYSICIAN:  Allie Bossier, MD        DATE OF BIRTH:  September 29, 1951   DATE OF SERVICE:                                  CLINIC NOTE   [name] is a 60 year old divorced white para 2, who states she went  through menopause in about 2006.  She states that for the last  approximately 3 years, she has been having episodes of bleeding every 6  months.  This has been evaluated with an ultrasound first done on July 03, 2008.  At that time, her endometrial thickness was noted to be 3 mm.  There was a 2.9-cm septated cystic lesion of the left ovary. A CA-125  was done, it was 2.2 (well within normal limits).  A followup ultrasound  done on October 07, 2008, shows that the left ovarian cyst has not  changed, and the uterus is normal with no thickening of the endometrium.  She still continues to have the bleeding.  Please note that an  endometrial biopsy was done in July, which showed no hyperplasia or  malignancy.  [name] also talks about a longstanding history of  stress urinary incontinence.  I have discussed all 3 of these problems  (ovarian cyst,  stress incontinence, and postmenopausal bleeding).  I have offered  watchful waiting with followup ultrasound for the cyst versus surgical  correction of all 3 problems (laparoscopic BSO, tension-free vaginal  tape, and vaginal hysterectomy).  She prefers the surgical approach to  the management of these problems, and I have turned in paperwork to that  effect.   Please note her past medical history is significant for the above plus  feet deformity, bilateral (3 toes on one foot, 4 toes on the other with  fusion of her tarsal bones).  Please note she is scheduled to have  surgery on November 29, 2008, for some issue related to this.  Other  medical issues include  obesity.   REVIEW OF SYSTEMS:  She has been in a monogamous relationship and lives  with her partner for the last 15 years.  She does describe deep  dyspareunia.  She does have some vaginal atrophy, but takes Premarin  cream for it.  She is unemployed.  Her Pap and her mammogram were both  normal in June 2009.   PAST SURGICAL HISTORY:  Cesarean section for breech, D&C for  dysfunctional uterine bleeding.   No known drug allergies.  No latex allergies.   MEDICATIONS:  1. Premarin cream 1 g three times a week.  2. Aleve tablets 500 mg b.i.d.   PHYSICAL EXAMINATION:  GENERAL:  Well-nourished, well-hydrated, pleasant  white female.  VITAL SIGNS:  Blood pressure 132/81, height 5 feet 2 inches, weight 176,  pulse 83.  HEENT:  Normal.  HEART:  Regular rate and rhythm.  ABDOMEN:  Mildly obese, benign.  PELVIC:  External genitalia, first-degree cystocele.  Bimanual exam  reveals a normal size  and shape, midplane, mobile, uterus.  Her adnexa  are nontender.  Well, her pelvis is not generous.  I do feel that with  the assistance of the laparoscope, a vaginal hysterectomy will be  possible.   ASSESSMENT AND PLAN:  Stress incontinence, septated left ovarian cyst,  and postmenopausal bleeding.  We will proceed with total vaginal  hysterectomy, tension-free vaginal tape, and laparoscopic bilateral  salpingo-oophorectomy, as the patient does not want to proceed with  watchful waiting.      Allie Bossier, MD     MCD/MEDQ  D:  11/21/2008  T:  11/22/2008  Job:  [id number]

## 2011-04-27 NOTE — Group Therapy Note (Signed)
[name], LAZARO NO.:  [id number]   MEDICAL RECORD NO.:  [id number]          PATIENT TYPE:  WOC   LOCATION:  [facility]                   [facility]ACILITY:  [facility]   PHYSICIAN:  Allie Bossier, MD        DATE OF BIRTH:  1951-02-01   DATE OF SERVICE:                                  CLINIC NOTE   Ms. Hemrick is a 60 year old divorced white gravida 2 who underwent an  uncomplicated laparoscopic-assisted vaginal hysterectomy, bilateral  salpingo-oophorectomy, and TVT on January 21, 2009.  Her pathology on  her uterus showed adenomyosis.  The right fallopian tube also did show  some focal subserosal endometriosis.  Postoperatively she has done very  well.  She has had normal bowel and bladder function.   She underwent a TVT, tension-free vaginal tape.  Since the surgery, she  does not leak urine when she coughs, but she does still have a small  amount of leaking when she sneezes.  She says that she has seasonal  allergies, especially to her indoor [place]  and that he is shedding this time of year.  I highly recommended that  she start on an antihistamine to prevent sneezing at this point in her  recovery.  She is willing to do this.   PHYSICAL EXAMINATION:  GENERAL:  She has gained another 4 pounds since  her preop visit, but please note that she had right ankle surgery just  prior to the surgery that I did, and she has been unable to exercise as  of yet.  VITAL SIGNS:  Her blood pressure today is 159/92; this is more elevated  than it was preoperatively as well; however, she has an appointment to  discuss this with her family practice doctor on March 15, 2009.  PELVIC:  Her cuff is well healed.  There are a few stitches that are  still visible.  Her TVT scars and laparoscopy scars are well healed.  Bimanual exam reveals no masses.   ASSESSMENT/PLAN:  Postoperatively stable.  Per patient request, I have  done a wet prep of her scant vaginal  discharge (she perceives an odor,  which I do not smell).  Should there be any abnormalities, I will treat  based on the microscopic findings.  I have told her she can return to  her normal sexual activity should she desire, and she will follow up in  a year with me and in 2 weeks with her family doctor for her blood  pressure.      Allie Bossier, MD     MCD/MEDQ  D:  02/26/2009  T:  02/27/2009  Job:  [id number]

## 2011-04-27 NOTE — Group Therapy Note (Signed)
[name], [name] NO.:  [id number]   MEDICAL RECORD NO.:  [id number]          PATIENT TYPE:  WOC   LOCATION:  [facility]                   [facility]ACILITY:  [facility]   PHYSICIAN:  Argentina Donovan, MD        DATE OF BIRTH:  06-Jun-1951   DATE OF SERVICE:                                  CLINIC NOTE   REASON FOR VISIT:  Results of ultrasound.   HISTORY:  This is a 60 year old P2 who is 2-3 years postmenopausal who  we have been following for some abnormal uterine bleeding episodes which  have not recurred since we began seeing her.  Her workup thus far has  been Pap smear which is negative, endometrial biopsy which is negative  and her pelvic ultrasound which is negative as far as endometrial stripe  which was 3 mm.  However, incidentally, there was a finding of 2.9 cm  left ovarian cyst.  On further questioning, the patient does have some  catching left pelvic pain which is positional and which has been present  for a month or so.  No family history of ovarian cancer.  Otherwise, she  is doing well as far as her mild stress urinary incontinence which is  much improved since she quit smoking.  Of note, she has also began an  exercise program, has purchased a bike and [facility]  She is not having any further dyspareunia since she  has begun the Premarin cream about 3 weeks ago.  She is using 1 gm every  other day.  Her vaginal dryness has also improved.   OBJECTIVE:  Exam is deferred as she had a complete physical on 06/28/08  with Dr. Sharol Harness.   ASSESSMENT/PLAN:  Septated 2.9 centimeter left ovarian cyst.  This  finding is discussed with Dr. Okey Dupre who suggests doing a CA-125 today and  to repeat the ultrasound in 3 months.     ______________________________  Caren Griffins, CNM    ______________________________  Argentina Donovan, MD    DP/MEDQ  D:  07/11/2008  T:  07/11/2008  Job:  [id number]

## 2011-04-27 NOTE — Op Note (Signed)
NAMEPA, [name]               ACCOUNT NO.:  [id number]   MEDICAL RECORD NO.:  [id number]          PATIENT TYPE:  INP   LOCATION:                                [facility]   PHYSICIAN:  Allie Bossier, MD        DATE OF BIRTH:  January 29, 1951   DATE OF PROCEDURE:  DATE OF DISCHARGE:                               OPERATIVE REPORT   PREOPERATIVE DIAGNOSES:  Postmenopausal bleeding, stress incontinence,  left ovarian cyst (complex left ovarian cyst).   PROCEDURE:  Laparoscopic removal of the adnexa, total vaginal  hysterectomy, and tension-free vaginal tape.   SURGEON:  Allie Bossier, MD   ASSISTANT:  Scheryl Darter, MD   COMPLICATIONS:  None.   ESTIMATED BLOOD LOSS:  Less than 200 mL.   SPECIMENS:  Uterus, tubes, and ovaries.   FINDINGS:  Normal atrophic ovaries, bilateral left adnexa did have a  multiple paratubal cysts, otherwise normal size and shape uterus.   DETAILED PROCEDURE AND FINDINGS:  Preoperatively, the risks, benefits,  and alternatives of the surgery were explained, understood and accepted,  and consents were signed.  Her IV antibiotics (Ancef was given in the  preop area).  In the operating room, she was placed in dorsal lithotomy  position.  Once she reported herself to be comfortable.  General  anesthesia was applied without complication.  Her abdomen and vagina  were prepped and draped in the usual sterile fashion.  A Foley catheter  was placed which drained clear urine throughout the case.  A Hulka  manipulator was placed vaginally.  Gloves were changed.  Attention was  turned to the abdomen.  A vertical umbilical incision was made.  A  Veress needle was placed intraperitoneally.  Low-flow CO2 was used to  insufflate the abdomen approximately 3-1/2 L.  A 10-mm trocar was  placed.  Lap was placed through the umbilical incision.  Laparoscopy  confirmed correct placement.  She was placed in Trendelenburg position  and a 5-mm port was placed in each lower  quadrant under direct  laparoscopic visualization, taking care to avoid the inferior epigastric  vessels.  Pelvis was inspected in alpha manner.  The adnexa on the right  was entirely normal.  Both adnexa were atrophic.  The posterior cul-de-  sac was normal.  The left oviduct did have multiple paratubal cysts.  The anterior cul-de-sac was normal.  Initially, we removed the left  adnexa.  The gyrus was used for the excision of the adnexa.  The ureter  on each side was noted at the beginning of the case and throughout the  case and noted to be well away from the operative site.  The left  infundibulopelvic ligament was ligated using the gyrus as well as.  The  broad ligament on each side was taken down with the gyrus.  Excellent  hemostasis was noted throughout.  At this point, we proceeded with the  vaginal portion of the case.  A weighted speculum was placed posteriorly  and Deaver anteriorly and a single-tooth tenaculum was placed across the  cervix.  Traction  was applied at the cervicovaginal junction.  A total  of 90 mL of a dilute Marcaine solution was injected for hydrodissection  purposes.  A circumferential incision was made at this site.  The  posterior peritoneum was entered, tagged, and held.  The anterior  peritoneum was entered as well.  The uterosacral ligaments were clamped,  cut, and doubly ligated.  These were tagged and held.  The remainder of  the cervical attachments to the pelvis were clamped, cut, and ligated. A  2-0 Vicryl suture is used unless otherwise specified.  The uterus was  removed intact and the left adnexa which had been placed in the cul-de-  sac during the laparoscopy was removed as well.  The specimens were all  sent to pathology together.  The pedicles were all inspected and noted  to be hemostatic.  The tagged uterosacral ligaments were then  incorporated into the vaginal cuff at each side for vaginal support.  The vaginal cuff was closed vertically  with a 2-0 Vicryl running locking  suture.  Excellent hemostasis was noted.  I then proceeded with the  tension-free vaginal tape.  I will allow the bladder to drain.  Clear  urine was noted at the completion of the hysterectomy portion of this  case.  I then made ink marks behind the symphysis pubis approximately a  centimeter and half lateral to the midline on each side.  I used  approximately 10 mL of the looped injection solution to hydrodissect at  each marked down behind the symphysis pubis on each side.  I then used  the injection solution to inject the vaginal mucosa approximately 1 cm  below the urethral meatus and bilaterally periurethrally.  I then used a  #11 blade to make a 1-1/2 cm incision at the site of the solution  injection then used Metzenbaum scissors to dissect on either side of the  urethra.  I then used the Gynecare tension-free vaginal tape to place  the tension-free vaginal tape on each side.  The trocars came out in the  premarked areas behind the symphysis pubis.  I was careful to keep the  trocar just close to the symphysis pubis as possible.  Please note that  during the placement of the trocars, a stylus was placed in the Foley  catheter for a bladder manipulation, keeping the bladder out of the  operative site each time.  The trocar was placed.  At this point, I  proceeded with the cystoscopy and noted that there was no perforation of  the bladder on either side.  The fluid was allowed to drain from the  bladder.  A Foley catheter was replaced as well, but again clear urine  was noted.  The incision in the vaginal mucosa was closed with a 4-0  Vicryl suture.  Dermabond was used to close the 2 puncture incisions or  puncture marks behind the symphysis pubis.  Gloves were changed.  Attention was turned to the abdomen once more.  The CO2 was used to  insufflate the abdomen again and laparoscopy confirmed that all pedicles  were hemostatic in the pelvis.  The  trocars were removed as the CO2 was  allowed to escape from the abdomen.  The tube lower 5-mm ports were  closed with Dermabond and the umbilical site was closed in the  subcutaneous tissue with the 3-0 Vicryl suture.  Instruments, sponge,  and needle counts were correct.  She was extubated and taken to recovery  room in  stable condition.      Allie Bossier, MD  Electronically Signed     MCD/MEDQ  D:  01/21/2009  T:  01/22/2009  Job:  [id number]

## 2011-04-27 NOTE — Group Therapy Note (Signed)
NAMEGENIENE, LIST NO.:  [id number]   MEDICAL RECORD NO.:  [id number]          PATIENT TYPE:  WOC   LOCATION:  [facility]                   FACILITY:  [facility]   PHYSICIAN:  Alexis Lemon, MD      DATE OF BIRTH:  [birth year]   DATE OF SERVICE:  06/28/2008                                  CLINIC NOTE   REASON FOR VISIT:  Followup results from endometrial biopsy performed 2  weeks ago.   HISTORY OF PRESENT ILLNESS:  [name] is a 60 year old gravida 2, para  2-0-0-2 who is apparently postmenopausal for about 2-3 years and had 2  episodes of bleeding within last 14 months prior to her first visit with  [place] in early June.  She was evaluated with Pap smear that showed no  abnormality.  She has also had endometrial biopsy 2 weeks ago showing  fragmented and proliferative-type endometrium.  No hyperplasia or  malignancy was identified.  She notes no bleeding since that time.  She  is otherwise doing well.  She states that she does have this catching  sensation when sitting up which lasts for 5-6 seconds after she sits up  and after the catching sensation, there is a sharp pain.  She is  concerned that it might it be a ovary, but points to her hip when she is  talking about this.  She has no other complaints today.   PAST MEDICAL HISTORY:  Arthritis.   PAST SURGICAL HISTORY:  Cesarean section in 1974.   FAMILY HISTORY:  Positive for diabetes, heart disease, acute myocardial  infarction, and cancer.   OBSTETRICAL HISTORY:  She has a history of 2 term deliveries, one by  cesarean section.   MEDICATIONS:  1. Prilosec, unknown dose 1 daily.  2. Ambien, unknown dose at night as needed.  3. Darvocet 1, unknown dose as needed.  4. Ibuprofen 800 mg times every 6 hours as needed for pain.  5. Premarin cream daily as prescribed.   ALLERGIES:  No known drug allergies.  No latex allergy.   REVIEW OF SYSTEMS:  As in the history of present illness.  The patient  has  complaints of continued fatigue and the catching sensation as noted  in the HPI.   PHYSICAL EXAMINATION:  GENERAL:  Well appearing, no other stress.  VITALS:  Temperature is 97.1, pulse 81, respirations 18, blood pressure  114/78, and weight 167.8 pounds.  CARDIOVASCULAR:  Heart:  Regular rate and rhythm with no murmurs, rubs,  or gallops.  RESPIRATORY:  Lungs are clear to auscultation bilaterally.  ABDOMEN:  Soft, nontender to palpation.  Positive bowel sounds in all 4  quadrants.  MUSCULOSKELETAL:  There is some mild discomfort when palpating just  below the pelvic brim on the patient's left side.  Hip is not  manipulated but passively the patient moves it without any catching or  pain.   ASSESSMENT AND PLAN:  A 60 year old gravida 2, para 2 with  postmenopausal bleeding, evaluated with endometrial biopsy that shows  proliferative endometrium without hyperplasia or malignancy identified.  The patient needs further evaluation with pelvic ultrasound to  evaluate  the endometrial stripe and ovaries.  Management, that will be based on  thickness of the endometrial stripe and the patient's continued  bleeding.  The patient has been counseled.  She will be followed  conservatively for endometrial stripe that is  less than 4 mm and  further intervention as dictated for an increased endometrial thickness.           ______________________________  Alexis Lemon, MD     NS/MEDQ  D:  06/28/2008  T:  06/28/2008  Job:  [id number]

## 2011-05-05 ENCOUNTER — Ambulatory Visit: Payer: Self-pay | Admitting: Internal Medicine

## 2011-05-07 ENCOUNTER — Other Ambulatory Visit: Payer: Self-pay | Admitting: *Deleted

## 2011-05-07 MED ORDER — ZOLPIDEM TARTRATE 5 MG PO TABS: 5.0000 mg | ORAL_TABLET | Freq: Every evening | ORAL | Status: DC | PRN

## 2011-05-14 ENCOUNTER — Ambulatory Visit: Payer: Self-pay | Admitting: Internal Medicine

## 2011-05-22 ENCOUNTER — Other Ambulatory Visit: Payer: Self-pay | Admitting: Internal Medicine

## 2011-06-13 ENCOUNTER — Ambulatory Visit: Payer: Self-pay | Admitting: Internal Medicine

## 2011-06-18 ENCOUNTER — Ambulatory Visit: Payer: Self-pay | Admitting: Surgery

## 2011-06-21 ENCOUNTER — Ambulatory Visit: Payer: Self-pay | Admitting: Internal Medicine

## 2011-06-23 ENCOUNTER — Ambulatory Visit: Payer: Self-pay | Admitting: Surgery

## 2011-06-25 LAB — PATHOLOGY REPORT

## 2011-07-08 ENCOUNTER — Other Ambulatory Visit: Payer: Self-pay | Admitting: *Deleted

## 2011-07-08 MED ORDER — ZOLPIDEM TARTRATE 5 MG PO TABS: 5.0000 mg | ORAL_TABLET | Freq: Every evening | ORAL | Status: DC | PRN

## 2011-07-08 NOTE — Telephone Encounter (Signed)
Refill approved - nurse to complete. 

## 2011-07-08 NOTE — Telephone Encounter (Signed)
Zolpidem rx called to [facility]. 

## 2011-07-14 ENCOUNTER — Ambulatory Visit: Payer: Self-pay | Admitting: Gastroenterology

## 2011-07-15 ENCOUNTER — Ambulatory Visit: Payer: Self-pay | Admitting: Internal Medicine

## 2011-07-23 ENCOUNTER — Ambulatory Visit: Payer: Self-pay | Admitting: Surgery

## 2011-07-29 ENCOUNTER — Inpatient Hospital Stay: Payer: Self-pay | Admitting: Surgery

## 2011-08-02 LAB — PATHOLOGY REPORT

## 2011-08-13 ENCOUNTER — Other Ambulatory Visit: Payer: Self-pay | Admitting: Internal Medicine

## 2011-08-13 DIAGNOSIS — K219 Gastro-esophageal reflux disease without esophagitis: Secondary | ICD-10-CM

## 2011-08-13 NOTE — Telephone Encounter (Signed)
It appears that the nexium dose has undergone numerous changes over the last several months.  The most recent documented change occurred when NSAIDs were blamed as the cause of her iron deficiency anemia.  The NSAIDs were stopped and the nexium dose was increased to 40 mg PO BID.  The latest prescription in the medication list has nexium 40 mg PO QD.  This seems to be the appropriate dose.  I tried calling [name] but received an unidentified voice mail so no message was left.  Will write for nexium 40 mg PO QD.  [name] is due for a follow-up visit, so I will get this scheduled.

## 2011-08-20 ENCOUNTER — Ambulatory Visit: Payer: Self-pay | Admitting: Internal Medicine

## 2011-09-03 LAB — POCT URINALYSIS DIP (DEVICE)
Glucose, UA: NEGATIVE
Nitrite: NEGATIVE
Operator id: 235561
Urobilinogen, UA: 0.2

## 2011-09-08 ENCOUNTER — Other Ambulatory Visit: Payer: Self-pay | Admitting: *Deleted

## 2011-09-08 MED ORDER — ZOLPIDEM TARTRATE 5 MG PO TABS: 5.0000 mg | ORAL_TABLET | Freq: Every evening | ORAL | Status: DC | PRN

## 2011-09-08 NOTE — Telephone Encounter (Signed)
Refill called to the [facility].

## 2011-09-09 LAB — POCT URINALYSIS DIP (DEVICE)
Glucose, UA: NEGATIVE
Nitrite: NEGATIVE
Operator id: 297281
Urobilinogen, UA: 0.2

## 2011-09-13 ENCOUNTER — Ambulatory Visit: Payer: Self-pay | Admitting: Internal Medicine

## 2011-10-14 ENCOUNTER — Ambulatory Visit: Payer: Self-pay | Admitting: Internal Medicine

## 2011-10-20 ENCOUNTER — Ambulatory Visit: Payer: Self-pay | Admitting: Internal Medicine

## 2011-10-22 ENCOUNTER — Ambulatory Visit: Payer: Medicare Other | Admitting: Internal Medicine

## 2011-10-22 ENCOUNTER — Encounter: Payer: Medicare Other | Admitting: Internal Medicine

## 2011-10-26 ENCOUNTER — Ambulatory Visit: Payer: Self-pay | Admitting: Internal Medicine

## 2011-11-13 ENCOUNTER — Ambulatory Visit: Payer: Self-pay | Admitting: Internal Medicine

## 2011-12-03 ENCOUNTER — Other Ambulatory Visit: Payer: Self-pay | Admitting: *Deleted

## 2011-12-08 MED ORDER — ZOLPIDEM TARTRATE 5 MG PO TABS: 5.0000 mg | ORAL_TABLET | Freq: Every evening | ORAL | Status: DC | PRN

## 2011-12-09 ENCOUNTER — Telehealth: Payer: Self-pay | Admitting: *Deleted

## 2011-12-09 NOTE — Telephone Encounter (Signed)
Zolpidem 5mg  rx called to [facility].

## 2011-12-14 ENCOUNTER — Ambulatory Visit: Payer: Self-pay | Admitting: Internal Medicine

## 2011-12-31 ENCOUNTER — Ambulatory Visit: Payer: Self-pay | Admitting: Rheumatology

## 2012-01-12 ENCOUNTER — Ambulatory Visit: Payer: Self-pay | Admitting: Internal Medicine

## 2012-01-12 LAB — CBC CANCER CENTER
Basophil #: 0 x10 3/mm (ref 0.0–0.1)
Basophil %: 0.3 %
Eosinophil #: 0.1 x10 3/mm (ref 0.0–0.7)
Lymphocyte %: 32.1 %
MCHC: 33.3 g/dL (ref 32.0–36.0)
Monocyte #: 0.2 x10 3/mm (ref 0.0–0.7)
Neutrophil %: 62.3 %
Platelet: 233 x10 3/mm (ref 150–440)
RDW: 20.6 % — ABNORMAL HIGH (ref 11.5–14.5)

## 2012-01-16 ENCOUNTER — Ambulatory Visit: Payer: Self-pay | Admitting: Internal Medicine

## 2012-01-18 LAB — OCCULT BLOOD X 1 CARD TO LAB, STOOL: Occult Blood, Feces: NEGATIVE

## 2012-01-19 ENCOUNTER — Other Ambulatory Visit: Payer: Self-pay | Admitting: *Deleted

## 2012-01-20 MED ORDER — ZOLPIDEM TARTRATE 5 MG PO TABS: 5.0000 mg | ORAL_TABLET | Freq: Every evening | ORAL | Status: DC | PRN

## 2012-01-20 NOTE — Telephone Encounter (Signed)
Called to pharm 

## 2012-01-25 ENCOUNTER — Ambulatory Visit: Payer: Self-pay | Admitting: Chiropractor

## 2012-01-28 ENCOUNTER — Ambulatory Visit: Payer: Self-pay | Admitting: Gastroenterology

## 2012-02-11 ENCOUNTER — Ambulatory Visit: Payer: Self-pay | Admitting: Internal Medicine

## 2012-02-15 ENCOUNTER — Other Ambulatory Visit: Payer: Self-pay | Admitting: *Deleted

## 2012-02-15 MED ORDER — ZOLPIDEM TARTRATE 5 MG PO TABS: 5.0000 mg | ORAL_TABLET | Freq: Every evening | ORAL | Status: AC | PRN

## 2012-02-15 NOTE — Telephone Encounter (Signed)
Rx phone into pharmacy. .

## 2012-04-19 ENCOUNTER — Other Ambulatory Visit: Payer: Self-pay | Admitting: *Deleted

## 2012-04-24 MED ORDER — ZOLPIDEM TARTRATE 5 MG PO TABS
5.0000 mg | ORAL_TABLET | Freq: Every evening | ORAL | Status: AC | PRN
Start: 2012-04-19 — End: 2012-05-19

## 2012-04-24 NOTE — Telephone Encounter (Signed)
Rx phoned into pharmacy.

## 2012-06-20 ENCOUNTER — Other Ambulatory Visit: Payer: Self-pay | Admitting: *Deleted

## 2012-06-22 NOTE — Telephone Encounter (Signed)
Not been seen since 3/12. He needs to call and make appt. THanks

## 2012-06-22 NOTE — Telephone Encounter (Signed)
Pharmacy informed.

## 2012-08-09 ENCOUNTER — Ambulatory Visit: Payer: Self-pay | Admitting: Internal Medicine

## 2012-08-22 ENCOUNTER — Other Ambulatory Visit: Payer: Self-pay | Admitting: Internal Medicine

## 2012-08-22 ENCOUNTER — Ambulatory Visit: Payer: Self-pay | Admitting: Podiatry

## 2012-08-25 ENCOUNTER — Ambulatory Visit: Payer: Self-pay | Admitting: Internal Medicine

## 2012-09-04 ENCOUNTER — Ambulatory Visit: Payer: Self-pay | Admitting: Podiatry

## 2012-09-04 LAB — CBC WITH DIFFERENTIAL/PLATELET
Basophil #: 0 10*3/uL (ref 0.0–0.1)
HCT: 40.3 % (ref 35.0–47.0)
Lymphocyte #: 1.8 10*3/uL (ref 1.0–3.6)
MCH: 29.6 pg (ref 26.0–34.0)
MCV: 87 fL (ref 80–100)
Monocyte #: 0.4 x10 3/mm (ref 0.2–0.9)
Monocyte %: 6.8 %
Neutrophil #: 3.2 10*3/uL (ref 1.4–6.5)
Platelet: 255 10*3/uL (ref 150–440)
RDW: 14.5 % (ref 11.5–14.5)
WBC: 5.3 10*3/uL (ref 3.6–11.0)

## 2012-09-08 ENCOUNTER — Ambulatory Visit: Payer: Self-pay | Admitting: Podiatry

## 2012-10-17 IMAGING — US ABDOMEN ULTRASOUND
1 series · 17 of 25 positions shown · non-contrast
Comparison: none

REASON FOR EXAM: neutropenia
COMMENTS:

[Series 1: abdomen ultrasound · 17 of 103 slices shown]
[im 1/103]
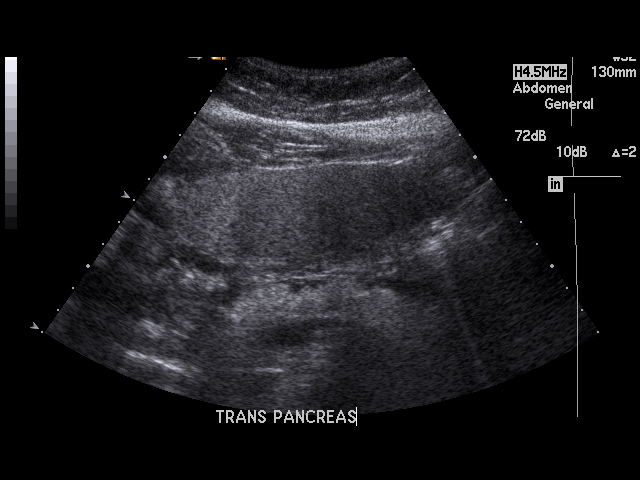
[im 9/103]
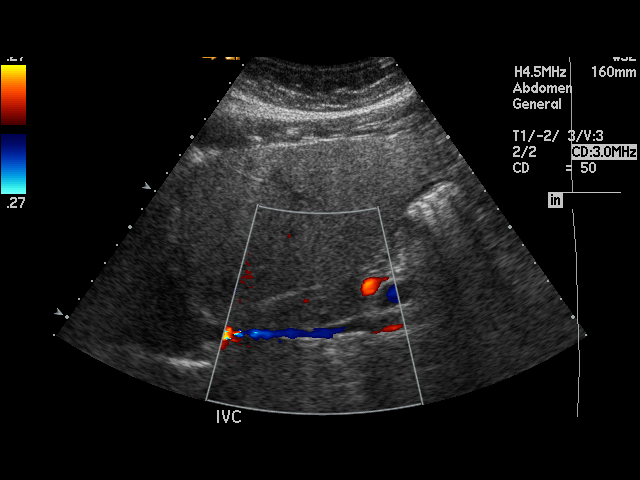
[im 13/103]
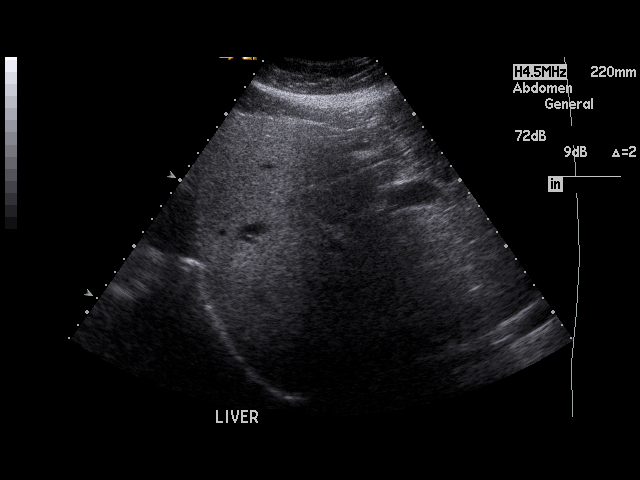
[im 22/103]
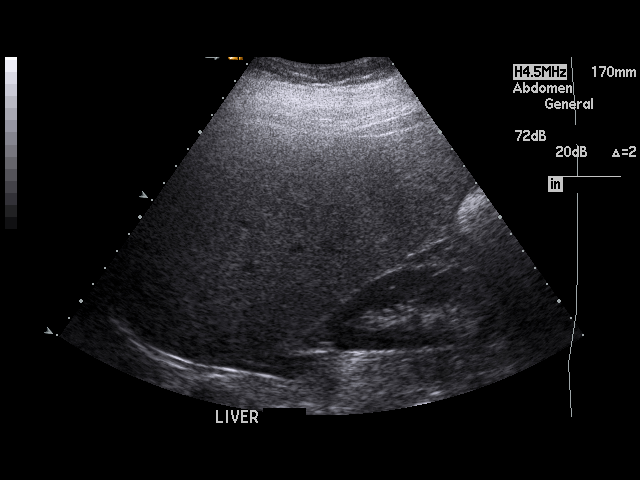
[im 26/103]
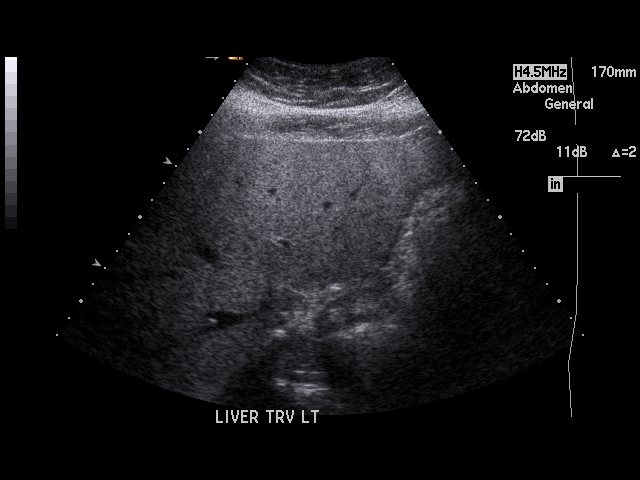
[im 35/103]
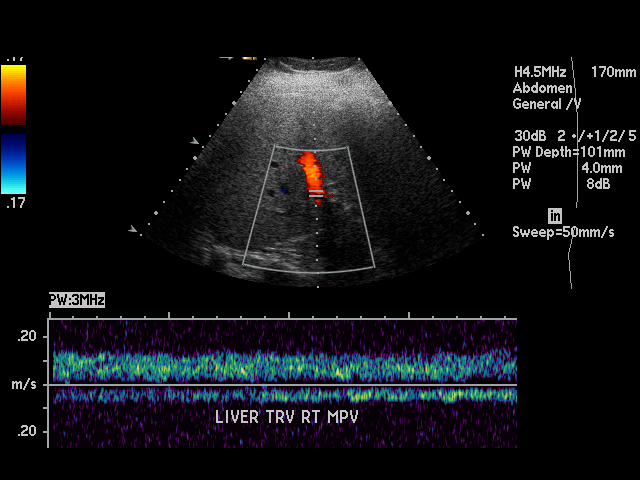
[im 39/103]
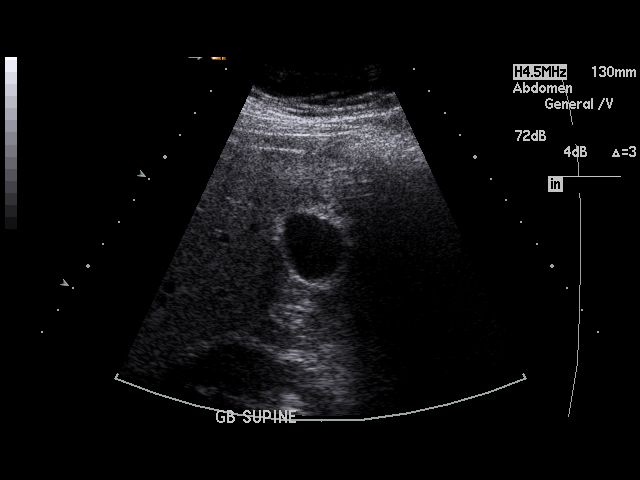
[im 47/103]
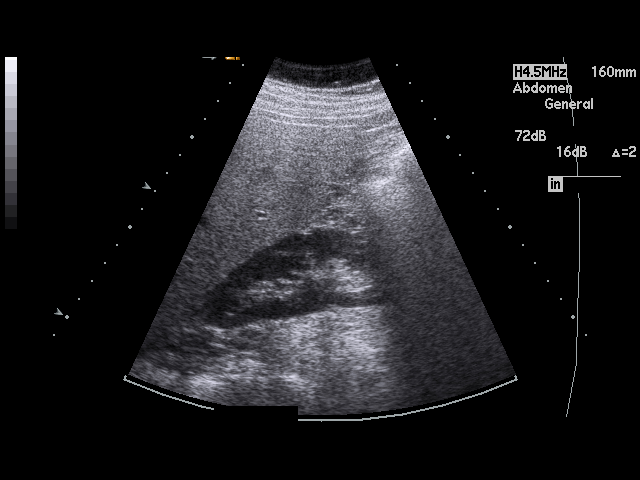
[im 52/103]
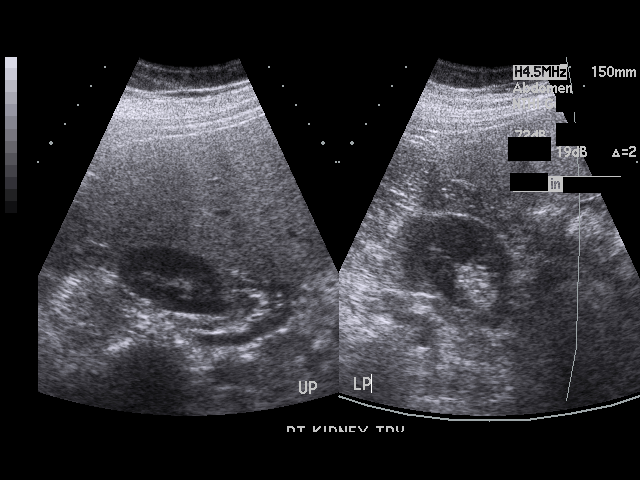
[im 56/103]
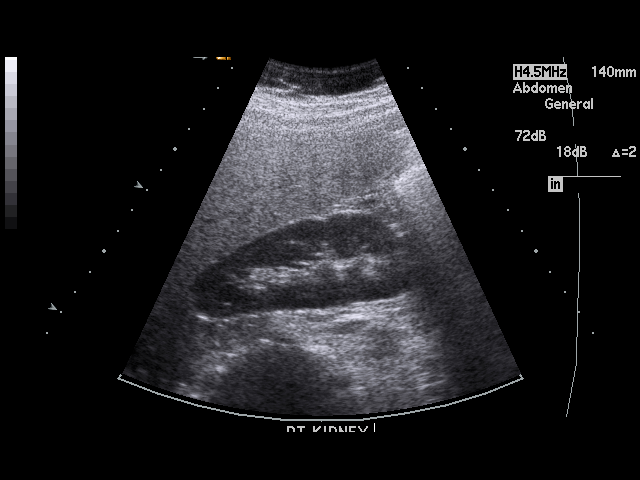
[im 64/103]
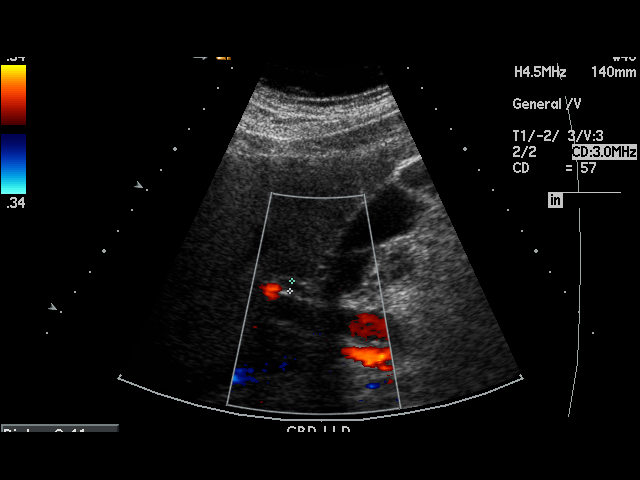
[im 69/103]
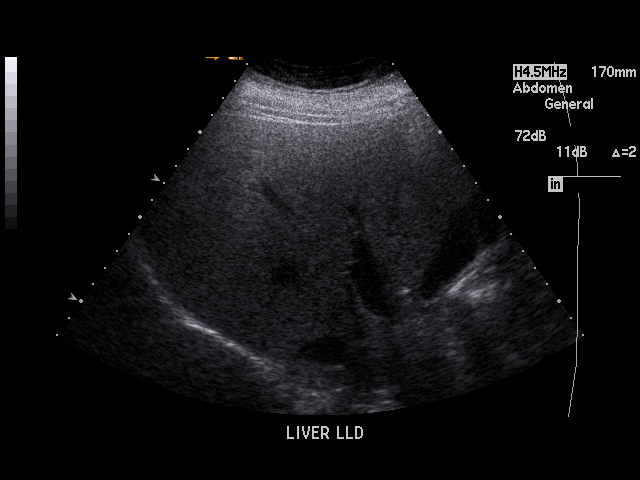
[im 77/103]
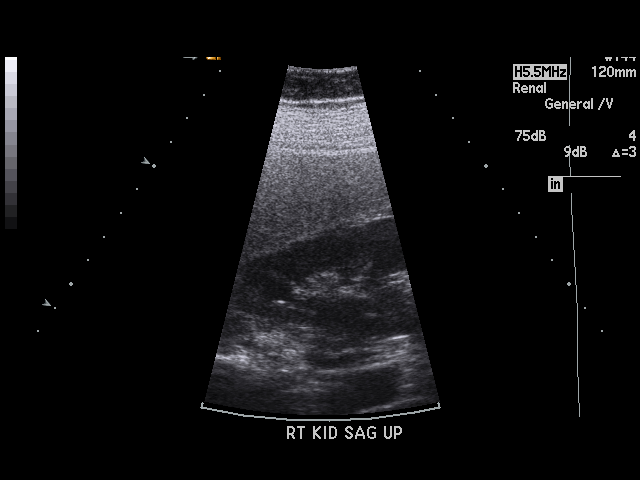
[im 81/103]
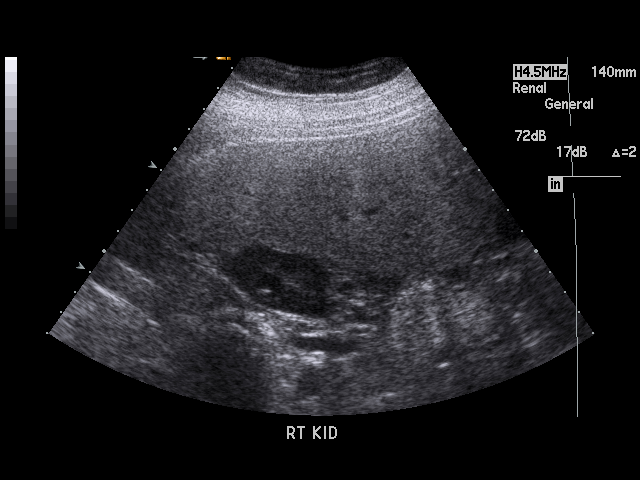
[im 90/103]
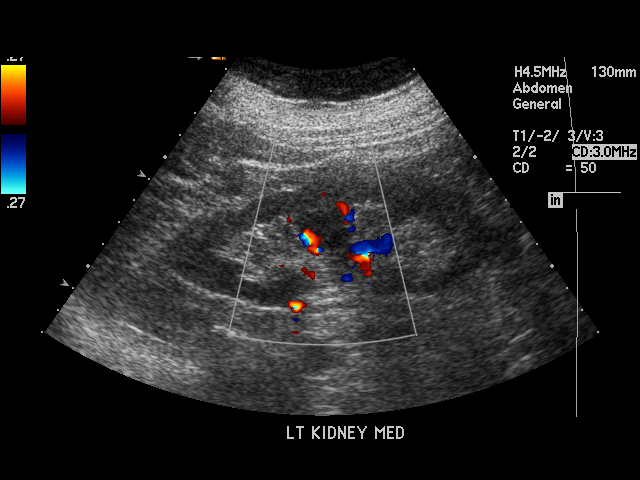
[im 94/103]
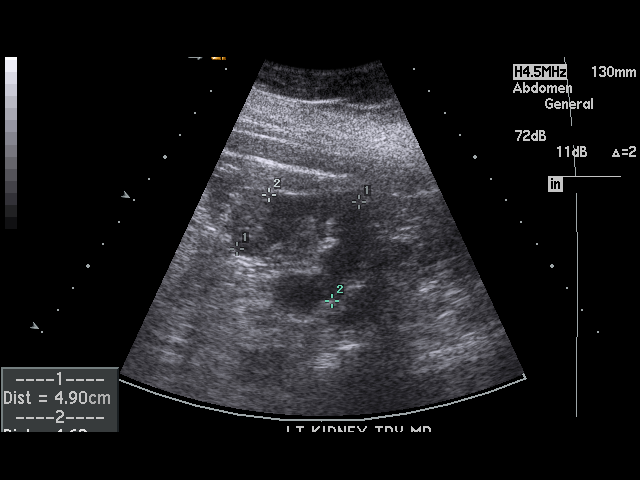
[im 103/103]
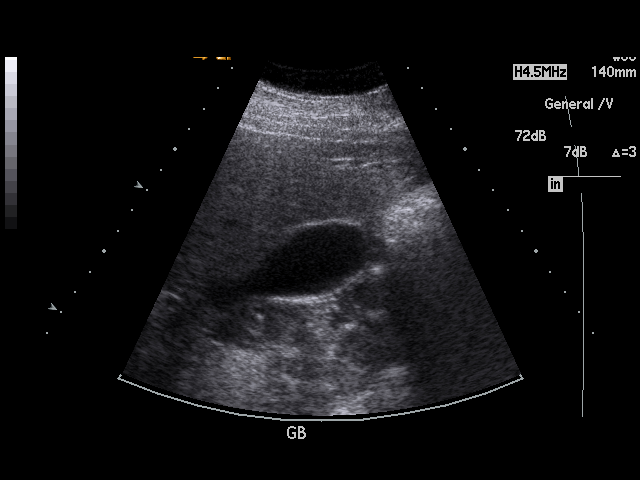

[17 of 25 positions shown; findings below may reference images not displayed]

PROCEDURE:     US  - US ABDOMEN GENERAL SURVEY  - May 06, 2011  [DATE]

RESULT:     The liver, spleen, pancreas, abdominal aorta and inferior vena
cava show no significant abnormalities. No gallstones are seen. There is no
thickening of the gallbladder wall. The common bile duct measures 4.1 mm in
diameter which is within normal limits. The kidneys show no hydronephrosis.
There is a 7.6 mm cyst at the upper pole of the right kidney. No ascites is
seen.
IMPRESSION: 1.  No gallstones or other acute changes identified.
2.  Incidental note is made of a small, right renal cyst.

## 2012-12-04 ENCOUNTER — Emergency Department: Payer: Self-pay | Admitting: Emergency Medicine

## 2012-12-04 LAB — BASIC METABOLIC PANEL
BUN: 13 mg/dL (ref 7–18)
Calcium, Total: 9.2 mg/dL (ref 8.5–10.1)
Co2: 26 mmol/L (ref 21–32)
Sodium: 137 mmol/L (ref 136–145)

## 2012-12-04 LAB — URINALYSIS, COMPLETE
Bacteria: NONE SEEN
Nitrite: NEGATIVE
Specific Gravity: 1.021 (ref 1.003–1.030)
WBC UR: 72 /HPF (ref 0–5)

## 2012-12-04 LAB — CBC
HCT: 38.2 % (ref 35.0–47.0)
MCHC: 32.5 g/dL (ref 32.0–36.0)
MCV: 89 fL (ref 80–100)
Platelet: 245 10*3/uL (ref 150–440)
RDW: 13.8 % (ref 11.5–14.5)

## 2013-04-18 ENCOUNTER — Ambulatory Visit: Payer: Self-pay | Admitting: Ophthalmology

## 2013-04-18 DIAGNOSIS — I10 Essential (primary) hypertension: Secondary | ICD-10-CM

## 2013-05-08 ENCOUNTER — Ambulatory Visit: Payer: Self-pay | Admitting: Ophthalmology

## 2013-05-18 ENCOUNTER — Ambulatory Visit: Payer: Self-pay | Admitting: Internal Medicine

## 2013-06-13 IMAGING — CR DG KNEE 1-2V*L*
1 series · 2 of 2 positions shown · non-contrast
Comparison: none

REASON FOR EXAM: COMMENTS:

PROCEDURE:     DXR - DXR KNEE LEFT AP AND LATERAL  - December 31, 2011  [DATE]
RESULT:     Comparison:  None

[Series 1: w knee ap left · 0.14mm/px · 2 of 2 slices shown]
[im 1/2]
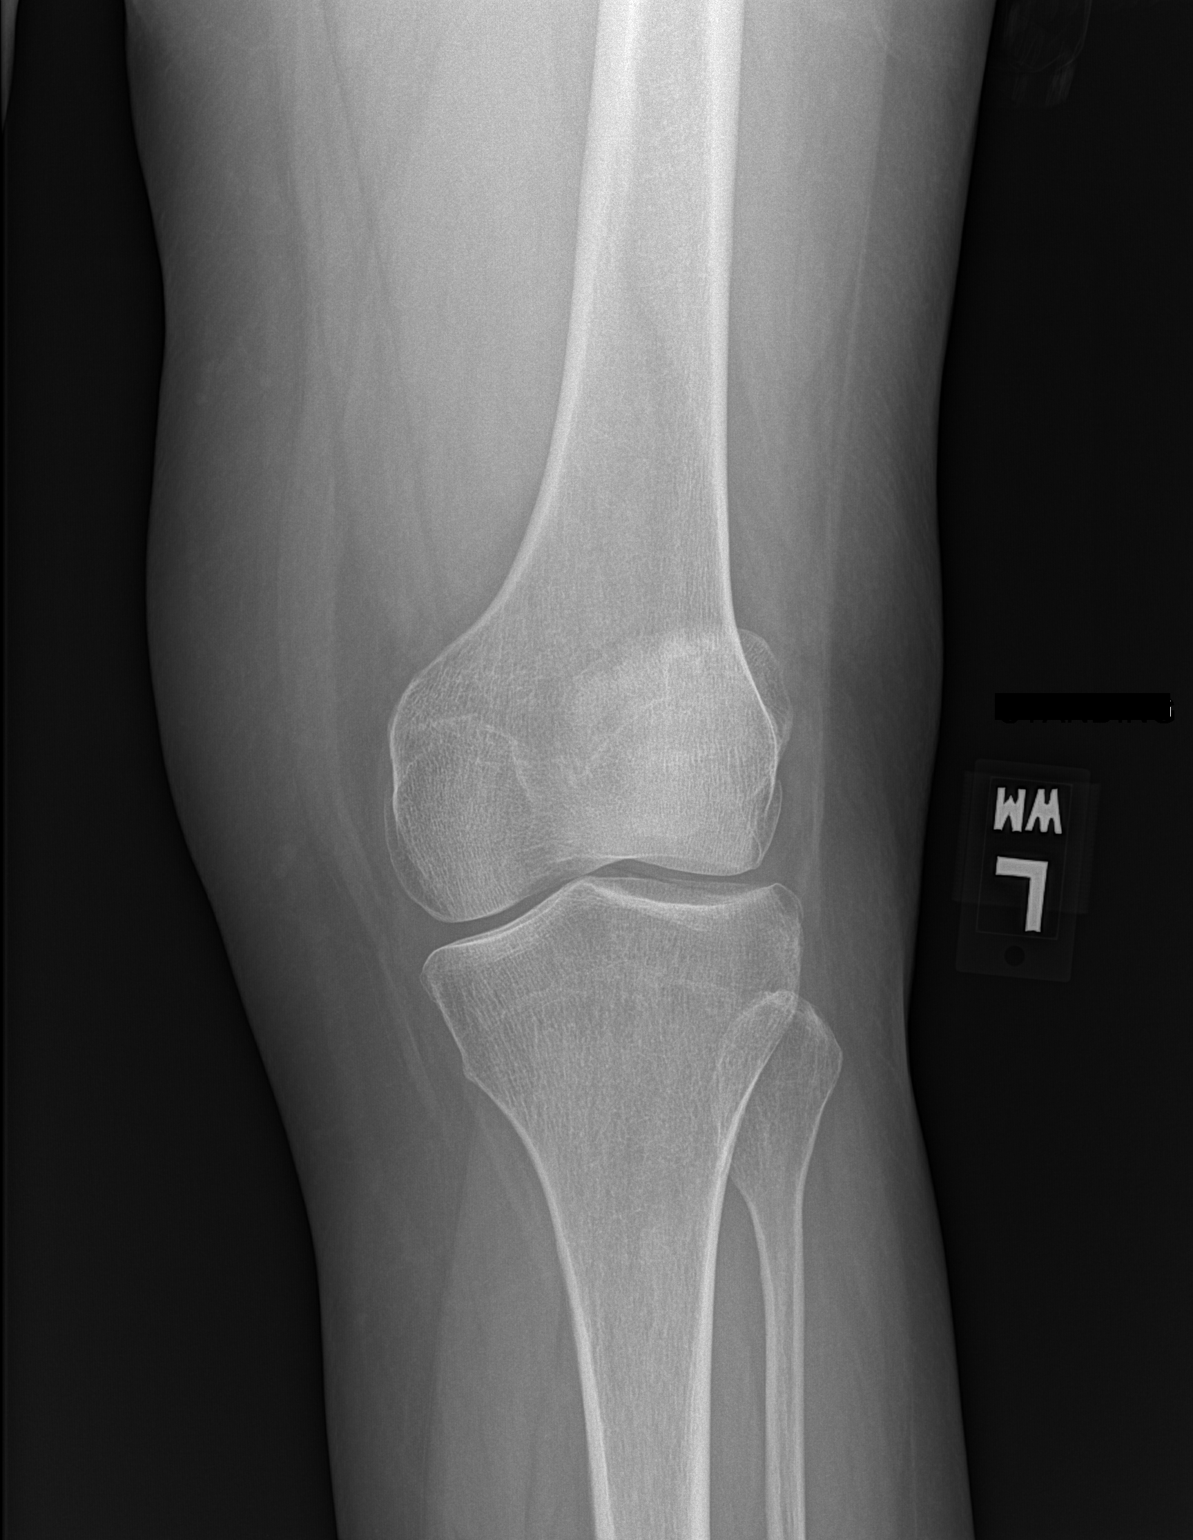
[im 2/2]
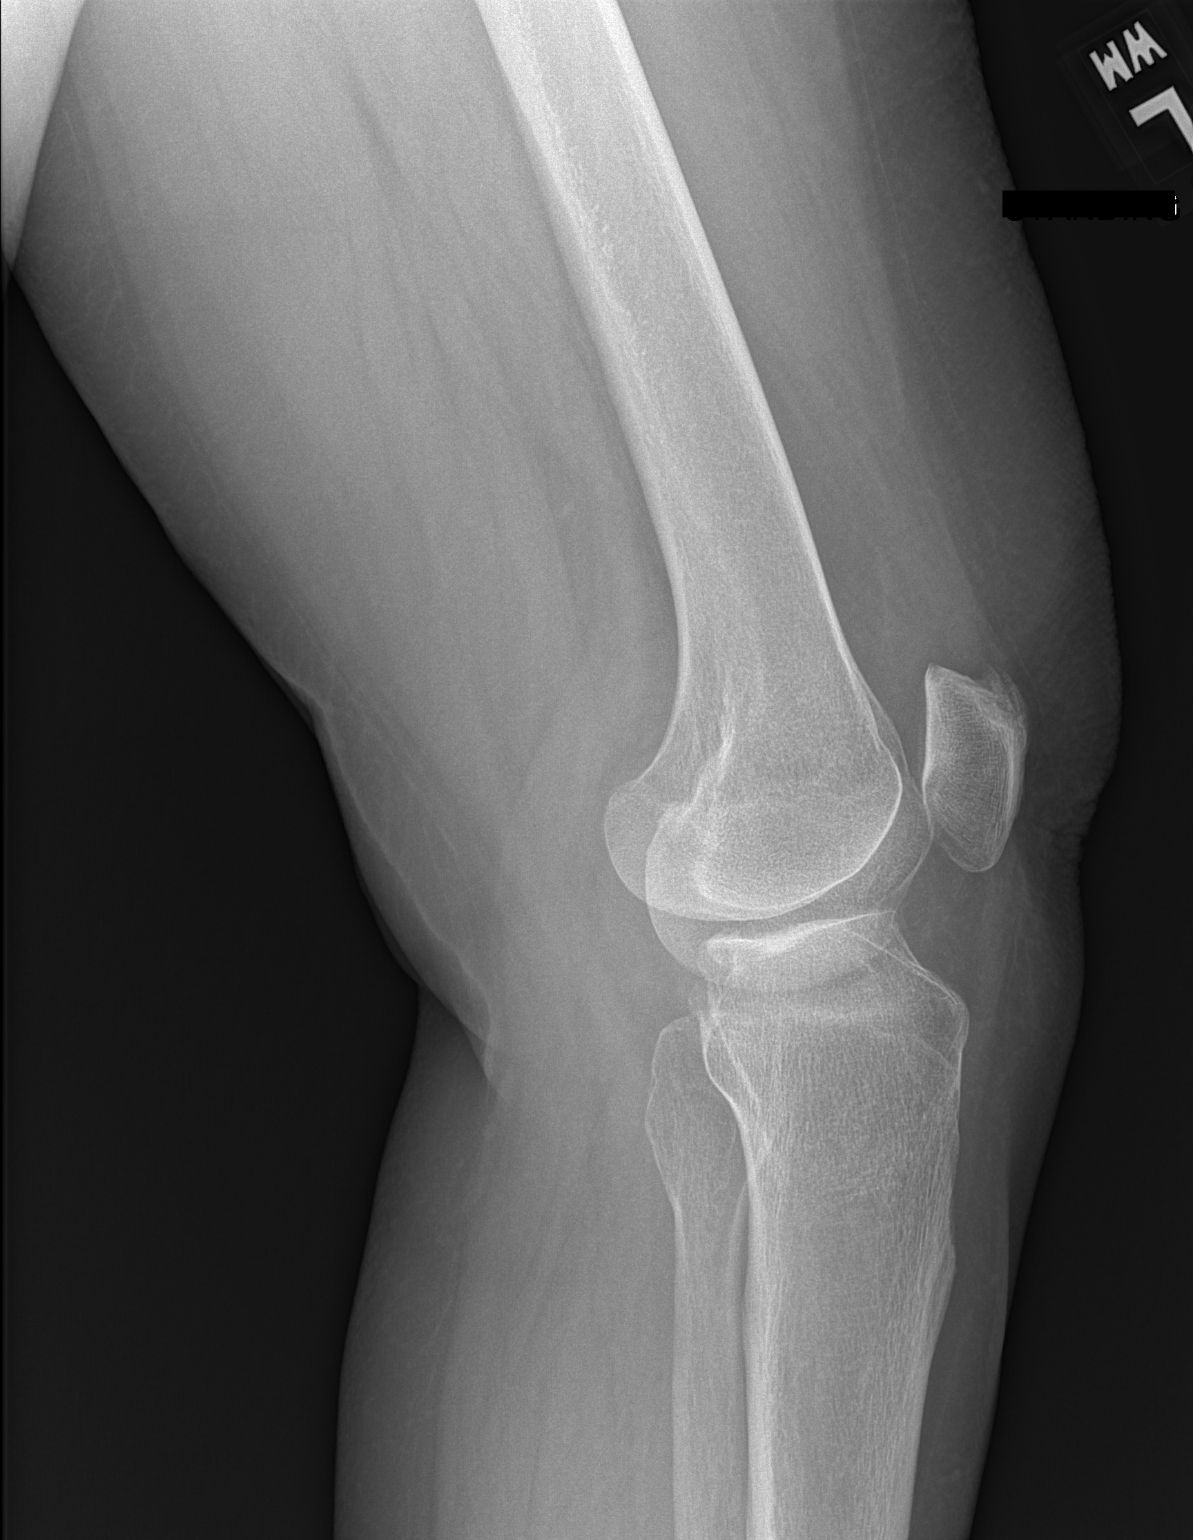

[2 of 2 positions shown; findings below may reference images not displayed]

FINDINGS: AP and lateral weight-bearing views of the left knee demonstrates no acute
fracture or dislocation. There is no significant joint effusion. The joint
spaces are maintained.
IMPRESSION: No acute osseous injury of the left knee.

## 2014-01-15 ENCOUNTER — Ambulatory Visit: Payer: Self-pay | Admitting: Ophthalmology

## 2014-02-04 ENCOUNTER — Ambulatory Visit: Payer: Self-pay | Admitting: Ophthalmology

## 2014-03-27 ENCOUNTER — Ambulatory Visit: Payer: Self-pay | Admitting: Family Medicine

## 2015-04-01 NOTE — Op Note (Signed)
PATIENT NAME:  [name], [name] MR#:  [id number] DATE OF BIRTH:  [birth year]  DATE OF PROCEDURE:  09/08/2012  PREOPERATIVE DIAGNOSIS: Hallux valgus deformity, right foot.   POSTOPERATIVE DIAGNOSIS: Hallux valgus deformity, right foot.   PROCEDURE: Arthrodesis right great toe joint.   SURGEON: Sharlotte Alamo, DPM  ANESTHESIA: Local MAC.   HEMOSTASIS: Pneumatic tourniquet, right ankle, 250 mmHg.   ESTIMATED BLOOD LOSS: Minimal.   PATHOLOGY: Right great toe joint bone from both sides.   MATERIALS: One 6-hole Medaris locking plate, one 2.0 x 8 mm locking screw, one 2.0 x 10 mm locking screw, one 2.0 x 13 mm locking screw, one 2.0 x 12 mm locking screw, one 2.0 x 16 mm locking screw and one 2.3 x 12 mm nonlocking screw.   COMPLICATIONS: Fracture through the fusion site with first fixation consisting of two staples.   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 Patient is having difficulty wearing shoes and elects for surgical correction.   DESCRIPTION OF PROCEDURE: Patient was taken to the Operating Room and placed on the table in the supine position. Following satisfactory sedation, the right foot was anesthetized with 10 mL of 0.5% Sensorcaine plain around the first metatarsal. Pneumatic tourniquet was applied at the level of the right ankle and the foot was prepped and draped in the usual sterile fashion. The foot was exsanguinated and the tourniquet inflated to 250 mmHg.   Attention was then directed to the dorsomedial aspect of the right foot where an approximate 6 cm linear incision was made coursing proximal to distal centered over the metatarsal and metatarsophalangeal joint onto the toe. The incision was deepened via sharp and blunt dissection down to the level of the joint where a linear capsulotomy and periosteal incision was made. Capsular and periosteal tissues reflected off of the medial  and dorsal aspect of the base of the proximal phalanx and hallux. A medial eminence was removed using a pneumatic saw. Next, using a Crescentic blade the cartilage was removed off of the base of the proximal phalanx followed by the head of the metatarsal. There was noted to be good alignment of the toe upon realigning the joint. At this point, the remaining edges were rongeured and rasped smooth to allow for staple fixation. Bone quality was noted to be adequate. A 15 mm compression staple was then applied medially across the joint fusion site and good compression was noted. Another 15 mm staple was then placed dorsally and upon release of the staple compression there was noted to be a fracture line plantarly. The fusion site did mobilize laterally. At this point both of the staples were then removed. The fusion was then realigned and fixated using a K wire. Next, a six hole plate was applied dorsally with appropriate locking and nonlocking screws into all of the holes. One screw was able to engage the plantar fragment for stabilization. Intraoperative FluoroScan views revealed good alignment of the toe in both the lateral and DP views. Osteotomy was well coapted. The wound was then flushed with copious amounts of sterile saline. The K wire was removed. The incision was then closed using 4-0 Vicryl running suture for all layers from capsular and periosteal closure to deep and superficial followed by skin. Tincture of benzoin and Steri-Strips applied followed by Xeroform and a sterile bandage. Tourniquet was released and blood flow noted to return immediately to the right foot in digits one  through five. A posterior splint made of plaster was then applied with the foot 90 degrees relative to the leg. Patient tolerated the procedure and anesthesia well and was transported to postanesthesia care unit with vital signs stable and in good condition.   ____________________________ Sharlotte Alamo, DPM tc:cms D: 09/08/2012  13:16:59 ET T: 09/08/2012 14:05:11 ET JOB#: [id number]  cc: Sharlotte Alamo, DPM, <Dictator> Jamaurie Bernier DPM ELECTRONICALLY SIGNED 09/09/2012 8:56

## 2015-04-04 NOTE — Op Note (Signed)
PATIENT NAME:  [name], [name] MR#:  [id number] DATE OF BIRTH:  February 25, 1951  DATE OF PROCEDURE:  05/08/2013  LOCATION: [facility]  PREOPERATIVE DIAGNOSIS: Visually significant cataract of the right eye.   POSTOPERATIVE DIAGNOSIS: Visually significant cataract of the right eye.   OPERATIVE PROCEDURE: Cataract extraction by phacoemulsification with implant of intraocular lens to right eye.   SURGEON: Birder Robson, MD.   ANESTHESIA:  1. Managed anesthesia care.  2. Topical tetracaine drops followed by 2% Xylocaine jelly applied in the preoperative holding area.   COMPLICATIONS: None.   TECHNIQUE:  Stop and chop.  DESCRIPTION OF PROCEDURE: The patient was examined and consented in the preoperative holding area where the aforementioned topical anesthesia was applied to the right eye and then brought back to the Operating Room where the right eye was prepped and draped in the usual sterile ophthalmic fashion and a lid speculum was placed. A paracentesis was created with the side port blade and the anterior chamber was filled with viscoelastic. A near clear corneal incision was performed with the steel keratome. A continuous curvilinear capsulorrhexis was performed with a cystotome followed by the capsulorrhexis forceps. Hydrodissection and hydrodelineation were carried out with BSS on a blunt cannula. The lens was removed in a stop and chop  technique and the remaining cortical material was removed with the irrigation-aspiration handpiece. The capsular bag was inflated with viscoelastic and the Tecnis ZCB00 21.0-diopter lens, serial number [id number], was placed in the capsular bag without complication. The remaining viscoelastic was removed from the eye with the irrigation-aspiration handpiece. The wounds were hydrated. The anterior chamber was flushed with Miostat and the eye was inflated to physiologic pressure. 0.1 mL of cefuroxime concentration 10 mg/mL was placed in the  anterior chamber. The wounds were found to be water tight. The eye was dressed with Vigamox. The patient was given protective glasses to wear throughout the day and a shield with which to sleep tonight. The patient was also given drops with which to begin a drop regimen today and will follow-up with me in one day.    ____________________________ Livingston Diones. Delancey Moraes, MD wlp:OSi D: 05/08/2013 12:33:58 ET T: 05/08/2013 13:39:10 ET JOB#: [id number]  cc: Riggin Cuttino L. Darreon Lutes, MD, <Dictator> Livingston Diones Brittnie Lewey MD ELECTRONICALLY SIGNED 05/10/2013 17:58

## 2015-04-05 NOTE — Op Note (Signed)
PATIENT NAME:  [name], KLUENDER MR#:  [id number] DATE OF BIRTH:  22-Apr-1951  DATE OF PROCEDURE:  02/04/2014  PREOPERATIVE DIAGNOSIS: Visually significant cataract of the left eye.   POSTOPERATIVE DIAGNOSIS: Visually significant cataract of the left eye.   OPERATIVE PROCEDURE: Cataract extraction by phacoemulsification with implant of intraocular lens to left eye.   SURGEON: Birder Robson, MD.   ANESTHESIA:  1. Managed anesthesia care.  2. Topical tetracaine drops followed by 2% Xylocaine jelly applied in the preoperative holding area.   COMPLICATIONS: None.   TECHNIQUE:  Stop and chop.  DESCRIPTION OF PROCEDURE: The patient was examined and consented in the preoperative holding area where the aforementioned topical anesthesia was applied to the left eye and then brought back to the Operating Room where the left eye was prepped and draped in the usual sterile ophthalmic fashion and a lid speculum was placed. A paracentesis was created with the side port blade and the anterior chamber was filled with viscoelastic. A near clear corneal incision was performed with the steel keratome. A continuous curvilinear capsulorrhexis was performed with a cystotome followed by the capsulorrhexis forceps. Hydrodissection and hydrodelineation were carried out with BSS on a blunt cannula. The lens was removed in a stop and chop technique and the remaining cortical material was removed with the irrigation-aspiration handpiece. The capsular bag was inflated with viscoelastic and the Tecnis ZCB00 21.5-diopter lens, serial number [id number] was placed in the capsular bag without complication. The remaining viscoelastic was removed from the eye with the irrigation-aspiration handpiece. The wounds were hydrated. The anterior chamber was flushed with Miostat and the eye was inflated to physiologic pressure. 0.1 mL of cefuroxime concentration 10 mg/mL was placed in the anterior chamber. The wounds were found to be water  tight. The eye was dressed with Vigamox. The patient was given protective glasses to wear throughout the day and a shield with which to sleep tonight. The patient was also given drops with which to begin a drop regimen today and will follow-up with me in one day.    ____________________________ Livingston Diones. Annalysa Mohammad, MD wlp:dmm D: 02/04/2014 11:38:50 ET T: 02/04/2014 12:12:34 ET JOB#: [id number]  cc: Terrell Shimko L. Deklen Popelka, MD, <Dictator> Livingston Diones Linus Weckerly MD ELECTRONICALLY SIGNED 02/08/2014 16:10

## 2019-11-20 DIAGNOSIS — F411 Generalized anxiety disorder: Secondary | ICD-10-CM

## 2020-01-21 DIAGNOSIS — L4050 Arthropathic psoriasis, unspecified: Secondary | ICD-10-CM

## 2020-01-21 DIAGNOSIS — F321 Major depressive disorder, single episode, moderate: Secondary | ICD-10-CM

## 2020-01-21 DIAGNOSIS — R110 Nausea: Secondary | ICD-10-CM

## 2020-01-22 DIAGNOSIS — K449 Diaphragmatic hernia without obstruction or gangrene: Secondary | ICD-10-CM

## 2020-01-22 DIAGNOSIS — K429 Umbilical hernia without obstruction or gangrene: Secondary | ICD-10-CM

## 2020-03-10 DIAGNOSIS — K2970 Gastritis, unspecified, without bleeding: Secondary | ICD-10-CM

## 2020-03-25 DIAGNOSIS — Z87891 Personal history of nicotine dependence: Secondary | ICD-10-CM

## 2020-03-25 DIAGNOSIS — W19XXXA Unspecified fall, initial encounter: Secondary | ICD-10-CM

## 2020-03-25 DIAGNOSIS — R413 Other amnesia: Secondary | ICD-10-CM

## 2020-03-25 DIAGNOSIS — H9191 Unspecified hearing loss, right ear: Secondary | ICD-10-CM

## 2020-04-16 MED ORDER — SERTRALINE 50 MG TABLET: 50 mg | ORAL | Status: AC

## 2020-04-16 MED ORDER — TRAZODONE 50 MG TABLET: 50 mg | ORAL | Status: AC

## 2020-04-16 MED ORDER — AMLODIPINE 5 MG TABLET
5 | ORAL | 2.00 refills | 90.00000 days | Status: AC
Start: 2020-04-16 — End: ?

## 2020-04-16 MED ORDER — DEXLANSOPRAZOLE 60 MG CAPSULE,BIPHASE DELAYED RELEASE
60 | ORAL | 1.00 refills | 90.00000 days | Status: AC
Start: 2020-04-16 — End: ?

## 2020-04-16 MED ORDER — PRAVASTATIN 40 MG TABLET: 40 mg | ORAL | Status: AC

## 2020-04-18 ENCOUNTER — Ambulatory Visit: Admit: 2020-04-18 | Discharge: 2020-04-18 | Payer: MEDICARE | Attending: Audiologist

## 2020-04-18 DIAGNOSIS — H9311 Tinnitus, right ear: Secondary | ICD-10-CM

## 2020-04-18 DIAGNOSIS — H903 Sensorineural hearing loss, bilateral: Secondary | ICD-10-CM

## 2020-04-18 LAB — AUDBASE COMPREHENSIVE HEARING
Aoustic Reflex @1000Hz Probe L: 100
Aoustic Reflex @1000Hz Probe L: 80
Aoustic Reflex @1000Hz Probe R: 100
Aoustic Reflex @1000Hz Probe R: 75
Aoustic Reflex @2000Hz Probe L: 85
Aoustic Reflex @2000Hz Probe L: 95
Aoustic Reflex @2000Hz Probe R: 100
Aoustic Reflex @2000Hz Probe R: 75
Aoustic Reflex @500Hz Probe Le: 85
Aoustic Reflex @500Hz Probe Le: 95
Aoustic Reflex @500Hz Probe Ri: 80
Aoustic Reflex @500Hz Probe Ri: 95
Aoustic Reflex Decay @1000Hz P: 0
Aoustic Reflex Decay @1000Hz P: 0
Aoustic Reflex Decay @1000Hz P: 2
Aoustic Reflex Decay @1000Hz P: 2
Aoustic Reflex Decay @2000Hz P: 0
Aoustic Reflex Decay @2000Hz P: 0
Aoustic Reflex Decay @2000Hz P: 0
Aoustic Reflex Decay @2000Hz P: 0
Aoustic Reflex Decay @4000Hz P: 0
Aoustic Reflex Decay @4000Hz P: 0
Aoustic Reflex Decay @4000Hz P: 0
Aoustic Reflex Decay @4000Hz P: 0
Aoustic Reflex Decay @500Hz Pr: 0
Aoustic Reflex Decay @500Hz Pr: 0
Aoustic Reflex Decay @500Hz Pr: 1
Aoustic Reflex Decay @500Hz Pr: 2
Aoustic Reflex Decay @Noise Pr: 0
Aoustic Reflex Decay @Noise Pr: 0
Aoustic Reflex Decay @Noise Pr: 0
Aoustic Reflex Decay @Noise Pr: 0
Audiogram Date: 20210507
Audiogram Specific Frequency T: 0
Audiogram Time: 103945
Bne Masked Right @ 1000Hz: 30
Bne Masked Right @ 250Hz: 20
Bne Masked Right @ 4000Hz: 20
Bne Unmasked Right @ 2000Hz: 15
Bne Unmasked Right @ 250Hz: 5
Bne Unmasked Right @ 4000Hz: 10
Bne Unmasked Right @ 500Hz: 25
Ear Canal Volume Left Ear: 1.165
Ear Canal Volume Right Ear: 1.391
Peak Admittance Left Ear: 0.351
Peak Admittance Right Ear: 0.328
Peak Pressure Left Ear: -19
Peak Pressure Right Ear: -6
Probe Tone Frequency Hz Left E: 226
Probe Tone Frequency Hz Right: 226
Pure Tone Air Unmasked Left @: 10
Pure Tone Air Unmasked Left @: 15
Pure Tone Air Unmasked Left @: 15
Pure Tone Air Unmasked Left @: 15
Pure Tone Air Unmasked Left @: 15
Pure Tone Air Unmasked Left @: 25
Pure Tone Air Unmasked Left @: 30
Pure Tone Air Unmasked Left @: 35
Pure Tone Air Unmasked Left: 30
Pure Tone Air Unmasked Right @: 20
Pure Tone Air Unmasked Right: 10
Pure Tone Air Unmasked Right: 15
Pure Tone Air Unmasked Right: 15
Pure Tone Air Unmasked Right: 15
Pure Tone Air Unmasked Right: 15
Pure Tone Air Unmasked Right: 15
Pure Tone Air Unmasked Right: 20
Pure Tone Air Unmasked Right: 30
Pure Tone Average Air Left: 15
Pure Tone Average Air Right: 0
Pure Tone Average Air Right: 15
Pure Tone Average Bne Left: 17
Pure Tone Bne Masked Left @ 10: 10
Pure Tone Bne Unmasked Left @: 10
Pure Tone Bne Unmasked Left @: 10
Pure Tone Bne Unmasked Left @: 15
Pure Tone Bne Unmasked Left @: 25
Pure Tone Bne Unmasked Left @: 5
Pure Tone Tinnitus Matching Fr: 0
Pure Tone Tinnitus Matching Fr: 0
SRT Air Left: 25
SRT Air Right: 25
Speech Test Date: 20210507
Speech Test Time: 103945
Tymp Grdnt Left Ear: 95
Tymp Grdnt Right Ear: 100
Tympanogram Date: 20210507
Tympanogram Time: 103945
Word Rec Left Trial 1 Percenta: 100
Word Rec Left Trial 1 dB Level: 65
Word Rec Right Trial 1 Percent: 100
Word Rec Right Trial 1 dB Leve: 65
~~LOC~~ AUD PURETONEAVG_AIR_L_FRE: 0
~~LOC~~ AUD PURETONEAVG_BNE_L_FRE: 0
~~LOC~~ AUD PURETONEAVG_BNE_R_FRE: 0
~~LOC~~ AUD PURETONEAVG_SFAID_FRE: 0
~~LOC~~ AUD PURETONEAVG_SFUNAID_F: 0

## 2020-04-18 NOTE — Progress Notes (Signed)
Adult Audiological Evaluation    Primary Physician: Federico Flake, NP  Referring Physician: Francetta Found*   Diagnosis: Sensorineural hearing loss, bilateral, and Tinnitus, right ear  Date of Service: 04/18/2020  Name of Provider: Dorathy Daft, AUD      HISTORY  Family Members/Caregivers/Associates in Attendance: none   Interpreter: No, English Speaking     Primary Complaint: Brenda Romero was seen today for a baseline hearing evaluation due to concern for a gradual decline in hearing in the right ear along with right sided tinnitus first perceived a few years ago. Her tinnitus is described as a static/hissing/buzzing mostly perceived when laying down or at bedtime. She also noted concern for right aural fullness and right sided jaw clicking.     Additional otologic history is as follows:  Hearing Loss: no concern for hearing loss  Previous Hearing Evaluation: No  Noise exposure: Yes, attended many concerts as a young adult.  Ear/infections/drainage: infections mainly as an adult. Most recent ear infection in July 2018 (patient recalls this occurring in the right ear)  Ear fullness Pressure: Yes, chronic in the right ear only  Ear Pain: No  Ear Surgery: No  Dizziness or Vertigo: Yes, issues with nausea due to gastrointestinal concerns. Generalized dizziness and lightheadedness. No vertigo  Falls or Risk: recalls an incident where she hit the front of her forehead on a school bus mirror. This knocked her to the ground and she still has a lump on the top of her head from this.   Tinnitus: Yes, see above  Sound Sensitivity: loud sounds have been uncomfortable most of her life. She has noticed increased sensitivity compared to friends and family.   Family History of Hearing Loss: father developed hearing loss later in life, wears amplification.   Hearing Aid Experience: No  Communication impact: background noise can be overwhelming.   Headache: Yes, history of generalized headaches  MRI/CT: No        RESULTS:  Otoscopic Evaluation:         Right Ear:  TM visualized and unremarkable  Left Ear:  TM visualized and unremarkable         Audiologic Evaluation: obtained today using insert earphones, retested with TDH headphones (see attached audiogram):   Right Ear: hearing sensitivity grossly within normal limits from [phone] Hz, with a mild notch at 6000 Hz.   Left Ear: hearing sensitivity within normal limits from [phone] Hz sloping to a mild high frequency hearing loss at 6000-8000 Hz.    Speech Reception Threshold (SRT):           Right Ear: 25 dBHL  Left Ear: 25 dBHL    Speech Recognition:       Right Ear: 100% at 37m dBHL, indicating excellent word recognition scores  Left Ear: 100% at 29m dBHL, indicating excellent word recognition scores    IMMITTANCE:  Tympanometry  Right Ear: revealed normal ear canal volume, pressure and compliance consistent with normal eardrum mobility.       Left Ear: revealed normal ear canal volume, pressure and compliance consistent with normal eardrum mobility.         Acoustic Reflexes 500 Hz- 2000 Hz:    Right Ipsi: present at expected levels   Right Contra: present at expected levels   Left Ipsi: present at expected levels   Left Contra: present at expected levels    Acoustic Reflex Decay [phone]:   Right Ear: abnormal at 500 Hz, normal at 1000 Hz  Left Ear: normal at [phone]    IMPRESSIONS AND RECOMMENDATIONS:  Audiometric results revealed a mild high frequency hearing loss at 6000-8000 Hz in the left ear and 6000 Hz in the right ear.  Speech reception thresholds were in agreement with pure tone thresholds, suggesting good reliability of results. Word recognition is excellent bilaterally. Tympanometry is consistent with normal TM mobility in both ears. Acoustic reflex thresholds are consistent with audiometric results. Acoustic reflex decay is abnormal in the right ear.    Todays results and treatment plan were discussed with the patient with regard to their  specific complaints. The patient was given the opportunity to ask questions and was answered appropriately.     1. Otologic follow up regarding concern for right aural fullness and non-pulsatile tinnitus with abnormal right reflex decay.  2. Re-evaluation of hearing in 6 months to monitor otologic concerns.  3. Tinnitus counseling. Application packet provided today.    Ollen Barges, Au.D. CCC-A  Audiologist

## 2020-04-21 DIAGNOSIS — R7309 Other abnormal glucose: Secondary | ICD-10-CM

## 2020-04-22 DIAGNOSIS — R7303 Prediabetes: Secondary | ICD-10-CM

## 2020-04-22 DIAGNOSIS — Z9181 History of falling: Secondary | ICD-10-CM

## 2020-04-23 ENCOUNTER — Ambulatory Visit: Admit: 2020-04-23 | Discharge: 2020-04-23 | Payer: MEDICARE

## 2020-04-23 DIAGNOSIS — R1084 Generalized abdominal pain: Secondary | ICD-10-CM

## 2020-04-23 DIAGNOSIS — K432 Incisional hernia without obstruction or gangrene: Secondary | ICD-10-CM

## 2020-04-23 NOTE — Patient Instructions (Signed)
Please obtain the images of your CT scan recently done     We will coordinate with you to get these records and arrange a follow-up clinic visit for additional discussion about possible further testing and recommendations.

## 2020-04-23 NOTE — H&P (Signed)
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    She underwent an endoscopy in March which demonstrated a small hiatal hernia, gastritis, duodenitis, and some stomach polyps. She has had a CT scan that reportedly demonstrated the aforementioned hernias, though we do not have images or a report.    She describes having the most pain from the superior/ subxyphoid hernia. She has to reduce it occasionally. She also describes occasional nausea. Her BMI is 29.8.    Patient Active Problem List   Diagnosis    Fall    Anemia    Arthropathic psoriasis, unspecified (CMS code)    Elevated hemoglobin A1c    Essential hypertension    Gastritis    Generalized abdominal pain    Generalized anxiety disorder    Hiatal hernia    History of falling    Major depressive disorder, single episode, moderate (CMS code)    Memory impairment    Nausea    Osteoarthrosis    Personal history of nicotine dependence    Prediabetes    Umbilical hernia    Unspecified hearing loss, right ear       Past Medical History:   Diagnosis Date    Anxiety 2014    Arthritis 2005    Autoimmune disease (CMS code) 2005    Depression 2014    GERD (gastroesophageal reflux disease) January, 2005    Hypertension 2010    Intestinal disease January 2020    Skin disease 1970    Psoriasis       Past Surgical History:   Procedure Laterality Date    CESAREAN SECTION  08/15/1973    EYE SURGERY  2014    Cataracts    HYSTERECTOMY  12/22/2008        Allergies/Contraindications  No Known Allergies    Current Outpatient Medications   Medication Sig Dispense Refill    amLODIPine (NORVASC) 5 mg tablet       1 tab orally every day      dexlansoprazole (DEXILANT) 60 mg DR capsule        1 cap orally every day      pravastatin (PRAVACHOL) 40 mg tablet       1 tab orally every day      sertraline (ZOLOFT) 50 mg tablet       1.5 tablets once in the morning      traZODone (DESYREL) 50 mg tablet       2 tabs orally every day at bedtime as needed for insomnia       No current facility-administered medications for this visit.       There are no discontinued medications.    Social History     Socioeconomic History    Marital status: Widowed     Spouse name: Not on file    Number of children: Not on file    Years of education: Not on file    Highest education level: Not on file   Occupational History    Not on file   Tobacco Use    Smoking status: Former Smoker     Packs/day: 0.50     Years: 20.00     Pack years: 10.00  Types: Cigarettes     Start date: 12/13/1978     Quit date: 12/13/1998     Years since quitting: 21.4    Smokeless tobacco: Never Used   Substance and Sexual Activity    Alcohol use: Yes     Alcohol/week: 2.0 standard drinks     Types: 2 Glasses of wine per week    Drug use: Yes     Frequency: 7.0 times per week     Types: Marijuana    Sexual activity: Not Currently   Other Topics Concern    Not on file   Social History Narrative    Not on file     Social Determinants of Health     Financial Resource Strain:     Difficulty of Paying Living Expenses:    Food Insecurity:     Worried About Programme researcher, broadcasting/film/video in the Last Year:     Barista in the Last Year:    Transportation Needs:     Freight forwarder (Medical):     Lack of Transportation (Non-Medical):    Physical Activity:     Days of Exercise per Week:     Minutes of Exercise per Session:    Stress:     Feeling of Stress :    Social Connections:     Frequency of Communication with Friends and Family:     Frequency of Social Gatherings with Friends and Family:     Attends Religious Services:     Active Member of Clubs or Organizations:     Attends Banker Meetings:     Marital Status:     Intimate Partner Violence:     Fear of Current or Ex-Partner:     Emotionally Abused:     Physically Abused:     Sexually Abused:      Answers for HPI/ROS submitted by the patient on 04/16/2020   headaches: Yes  Ringing in Ears: Yes  nausea: Yes  abdominal pain: Yes  diarrhea: Yes  constipation: Yes  Depression: Yes  nervous/anxious: Yes  Insomnia: Yes    Constitutional:       General: She is not in acute distress.     Appearance: Normal appearance. She is not ill-appearing, toxic-appearing or diaphoretic.      Comments: Exam limited due to videoconference visit - the following findings were noted by video exam.  No vital signs have been obtained today due to videoconferencing.   HENT:      Head: Normocephalic and atraumatic.   Eyes:      Extraocular Movements: Extraocular movements intact.      Pupils: Pupils are equal, round, and reactive to light.   Pulmonary:      Effort: Pulmonary effort is normal. No respiratory distress.   Abdominal:      General: Abdomen is flat.   Skin:     Coloration: Skin is not pale.      Findings: No lesion.      Comments: Of the visible skin by videoconference:   Neurological:      General: No focal deficit present.      Mental Status: She is alert and oriented to person, place, and time.   Psychiatric:         Mood and Affect: Mood normal.         Behavior: Behavior normal.         Thought Content: Thought content normal.  Judgment: Judgment normal.       Assessment and Plan:  Brenda Romero is a 69 y.o. female with incisional ventral hernias and a small hiatal hernia who has pain related to the ventral hernias and nausea. We would like to obtain the images of the CT scan she recently had done before proceeding with additional testing or surgery. We will coordinate with the patient to get these records and have her return to clinic for additional discussion.        I performed this consultation using real-time Telehealth tools, including a live video connection between my  location and the patient's location. Prior to initiating the consultation, I obtained informed verbal consent to perform this consultation using Telehealth tools and answered all the questions about the Telehealth interaction

## 2020-04-29 ENCOUNTER — Ambulatory Visit: Admit: 2020-04-29 | Discharge: 2020-05-06 | Payer: MEDICARE | Primary: Ambulatory Care

## 2020-04-29 DIAGNOSIS — Z0189 Encounter for other specified special examinations: Secondary | ICD-10-CM

## 2020-04-29 DIAGNOSIS — Z87891 Personal history of nicotine dependence: Secondary | ICD-10-CM

## 2020-06-19 ENCOUNTER — Inpatient Hospital Stay: Payer: MEDICARE | Primary: Physician

## 2020-08-05 ENCOUNTER — Inpatient Hospital Stay: Admit: 2020-08-05 | Payer: MEDICARE | Primary: Physician

## 2020-08-05 DIAGNOSIS — R197 Diarrhea, unspecified: Secondary | ICD-10-CM

## 2020-08-05 LAB — POCT CREATININE: Creatinine Whole Blood: 0.7 mg/dL (ref 0.6–1.3)

## 2020-08-05 MED ORDER — IOHEXOL 350 MG IODINE/ML INTRAVENOUS SOLUTION
350 | Freq: Once | INTRAVENOUS | Status: AC
Start: 2020-08-05 — End: 2020-08-05
  Administered 2020-08-05: 17:00:00 150 mL via INTRAVENOUS

## 2020-09-08 MED ORDER — CHOLESTYRAMINE-ASPARTAME 4 GRAM ORAL POWDER FOR SUSP IN A PACKET
4 | ORAL | 3.00 refills | Status: AC
Start: 2020-09-08 — End: 2022-02-08

## 2020-09-09 ENCOUNTER — Ambulatory Visit: Admit: 2020-09-09 | Discharge: 2020-09-09 | Payer: MEDICARE

## 2020-09-09 DIAGNOSIS — R1031 Right lower quadrant pain: Secondary | ICD-10-CM

## 2020-09-09 DIAGNOSIS — R110 Nausea: Secondary | ICD-10-CM

## 2020-09-09 DIAGNOSIS — R197 Diarrhea, unspecified: Secondary | ICD-10-CM

## 2020-09-09 DIAGNOSIS — K219 Gastro-esophageal reflux disease without esophagitis: Secondary | ICD-10-CM

## 2020-09-09 MED ORDER — DICYCLOMINE 10 MG CAPSULE
10 | ORAL_CAPSULE | Freq: Four times a day (QID) | ORAL | 1 refills | 30.00000 days | Status: AC
Start: 2020-09-09 — End: 2020-10-07

## 2020-09-09 MED ORDER — FAMOTIDINE 40 MG TABLET
40 | ORAL_TABLET | Freq: Every evening | ORAL | 2 refills | Status: DC
Start: 2020-09-09 — End: 2020-10-01

## 2020-09-09 NOTE — Addendum Note (Signed)
Addended by: Ayaka Andes on: 09/15/2020 08:58 PM     Modules accepted: Level of Service

## 2020-09-09 NOTE — Patient Instructions (Signed)
It was a pleasure seeing you in clinic via zoom today! Be sure to wash your hands and social distance when possible. Regarding your GI symptoms, we recommend the following:    1. Continue taking cholestyramine daily. This is a medication that you can titrate own your own based on symptoms.    2. We have prescribed a medication called Bentyl, a gut anti-spasmodic medication that helps to relax the muscles in the intestines. This can be used when you have breakthrough symptoms.    3. Please share your recent celiac testing results with [place].    4. We have ordered an additional stool test for you to have to complete your work up. If possible, avoid NSAIDs (anti-inflammatory medication such as Ibuprofen, Motrin, Naproxen) because these may make your diarrhea worse. If pain medication is needed, try using Tylenol.    5. Continue taking Dexilant 60 mg daily. Also try taking Pepcid each night scheduled (40 mg before bed) to avoid breakthrough symptoms or nausea in the morning.    6. See below for other helpful tips about lifestyle changes to help with reflux and information about foods high in fiber which can help to bulk up your stools.    GASTROESOPHAGEAL REFLUX  Many avoidable foods, habits and activities are known to increase reverse flow of stomach contents back into the esophagus, thus irritating the lining of the esophagus and stomach.     AVOID/LIMIT THESE:   1. Large meals.   2. Lying down after meals.   3. Exercise after meals.   4. Any solid food within three hours of bedtime.   5. Liquids (even water) within half an hour of bedtime.   6. Tight clothing such as belts, girdles, etc.   7. Fatty foods and chocolate, which cause the stomach to produce more acid, and relax the lower esophagus.   8. Coffee, peppermint and chocolate, which relax the lower esophagus.   9. Alcohol and highly spiced foods, which are irritants.   10. Carbonated beverages, which cause belching.   11. Aspirin, Ibuprofen, Advil and other  "arthritis pills", because they break down the protective barrier of the stomach and esophagus lining.  ~ Tylenol is okay.   12. Smoking.     DO THESE:   1. Lose weight if you are overweight.   2. Elevate the head of your bed 4 in. using blocks or old books or invest in a Styrofoam wedge pillow which can be purchased on [place].   ~ Use can use cans filled with sand if your bed has rollers. DO NOT use an extra pillow.   3. Avoid bending forward to pick up objects from the ground, put on shoes, etc.  ~ Instead, squat or sit back.   4. Gaviscon at bedtime may be helpful. Avoid calcium carbonate antacids such as Tums, because of rebound increased acidity that occurs from one to two hours later.      OTC medications available:  1.  As noted Gaviscon.  2.  Pepcid, Zantac or Tagament:  Take 1-2x/day.  3.  Prilosec, Prevacid or Nexium:  Take 1/day.    Learning About Foods That Are Good Sources of Fiber  What foods are high in fiber?     The foods you eat contain nutrients, such as vitamins and minerals. Fiber is a nutrient. Your body needs the right amount to stay healthy and work as it should. You can use the list below to help you make choices about which foods to  eat.  Here are some examples of foods that are good sources of fiber.  Fruits   Apple   Apricot   Avocado   Banana   Blackberries   Cherries   Melon   Pear   Raspberries  Grains   Amaranth   Barley   Bran cereal   Farro   Oat bran   Oatmeal   Quinoa   Rice (brown or wild)   Whole-grain bread   Whole-grain English muffin  Protein foods   Almonds   Beans (black, kidney, navy, pinto)   Chia seeds   Garbanzo beans   Lentils   Pumpkin seeds   Split peas   Sunflower seeds  Vegetables   Artichoke   Broccoli   Brussels sprouts   Cabbage   Carrots   Cauliflower   Eggplant   Green peas   Kale   Pumpkin   Sweet potato   White potato  Work with your doctor to find out how much of this nutrient you need. Depending on your health, you  may need more or less of it in your diet.  Where can you learn more?  Go to [employer]  Enter F355 in the search box to learn more about "Learning About Foods That Are Good Sources of Fiber."  Current as of: December 17, 2020Content Version: 13.0   2006-2021 [employer].   Care instructions adapted under license by your healthcare professional. If you have questions about a medical condition or this instruction, always ask your healthcare professional. [employer] disclaims any warranty or liability for your use of this information.

## 2020-09-09 NOTE — Progress Notes (Signed)
Brenda Romero is a 69 y.o. female seen via telehealth for a new visit consultation for Diarrhea and Right lower quadrant pain   at the request of Erasmo Leventhal, NP.     I performed this consultation using real-time telehealth tools, including a live video connection between my location and the patient's location. Prior to initiating the consultation, I obtained informed verbal consent to perform this consultation using telehealth tools and answered all the questions about the telehealth interaction. My location is not in a [place] clinical facility.    History of Present Illness  This is a 69 y.o. female with HTN, HLD, GERD, insomnia, and psoriasis, who presents for evaluation of diarrhea.    She has had issues with IBD for several years that worsened in the past year. She had loose stools that fluctuate, up to 10 times a day at the worst. She moved to [facility] in January but had issues prior to moving. She also had urgency so severe that she was afraid to leave the house to go for a walk. Infectious stool studies were negative and TSH was normal. She had no improvement with Imodium Q6. Her symptoms have improved dramatically with cholestyramine. She had constipation with cholestyramine BID and has been taking it daily. With this medicine she generally has 1 formed bowel movement a day. She occasionally has loose stools (up to 3/day) with abdominal cramping.    She also complains of daily nausea. She does not wake up with nausea, it begins in the morning. She has reflux mainly at night when lying down. She takes Dexilant daily which has helped but occasionally has breakthrough nausea. She takes Pepcid intermittently (roughly 3 times a week) and took 2 Dexilant recently due to her symptoms. Her symptoms improved on a gluten free diet. She recently had celiac testing which she states was negative. She denies fever, chills, weight loss, significant abdominal pain, vomiting, constipation, recent diarrhea, or blood in  her stool.    Past Medical History  Past Medical History:   Diagnosis Date    Anxiety 2014    Arthritis 2005    Autoimmune disease (CMS code) 2005    Depression 2014    GERD (gastroesophageal reflux disease) January, 2005    Hypertension 2010    Intestinal disease January 2020    Skin disease 1970    Psoriasis     Past Surgical History  Past Surgical History:   Procedure Laterality Date    CESAREAN SECTION  08/15/1973    EYE SURGERY  2014    Cataracts    HYSTERECTOMY  12/22/2008     Social History  She reports that she quit smoking about 21 years ago. Her smoking use included cigarettes. She started smoking about 41 years ago. She has a 10.00 pack-year smoking history. She has never used smokeless tobacco. She reports current alcohol use of about 2.0 standard drinks of alcohol per week. She reports current drug use. Frequency: 7.00 times per week. Drug: Marijuana.    Family History  family history includes Alzheimer's disease in her father; Arthritis in her mother; Asthma in her mother; Cancer in her brother and father.    Medications  Medications the patient states to be taking prior to today's encounter.   Medication Sig    amLODIPine (NORVASC) 5 mg tablet       1 tab orally every day    cholestyramine light (CHOLESTYRAMINE LIGHT) 4 gram packet 4 g daily       dexlansoprazole (  DEXILANT) 60 mg DR capsule       1 cap orally every day    pravastatin (PRAVACHOL) 40 mg tablet       1 tab orally every day    sertraline (ZOLOFT) 50 mg tablet 50 mg       traZODone (DESYREL) 50 mg tablet       2 tabs orally every day at bedtime as needed for insomnia     Allergies  Allergies/Contraindications  No Known Allergies    Review of Systems  Review of Systems   Constitutional: Positive for malaise/fatigue and weight loss. Negative for chills and fever.   Respiratory: Negative for cough and shortness of breath.    Cardiovascular: Negative for chest pain.   Gastrointestinal: Positive for abdominal pain, diarrhea,  heartburn and nausea. Negative for blood in stool, constipation, melena and vomiting.   All other systems reviewed and are negative.    Physical Exam  Constitutional: Appears in no apparent distress.  Eyes: Anicteric sclerae.  Ears, Nose, Mouth, Throat: no apparent oral thrush.  Respiratory: Completing full sentences without respiratory distress.  No accessory muscle use.  Cardiovascular: No jugular venous distention sitting upright at 90 degrees.  Hem/Lymphatic: No petechia on bilateral cheeks or neck.  Musculoskeletal: No muscle wasting of the temples or peri-clavicular areas bilaterally.  Skin: No rashes of the face or neck.  Neurologic: Normal comprehension, fluent speech, grossly symmetric cranial nerves.  Psychiatric: Congruent affect.    Records Review   I have personally reviewed prior records from [facility] and [facility].  These are summarized within the history of present illness.    Labs  I have personally reviewed the following blood tests listed below:    07/24/20  GI profile, stool  negative    07/22/20  CMP  normal (aside from K 3.4)  CBC  normal  Lipase  81  TSH  normal    Imaging/Endoscopic Reports  I have personally reviewed the available records for the imaging studies and endoscopic reports including all outside records summarized below:    Imaging  CT A/P (08/05/20):  - GI Tract: There is fat in the wall of the right and transverse colon which could indicate chronic burned out inflammation in the setting of inflammatory bowel disease. However, there is no evidence of acute inflammation involving the small bowel or colon.     Impression: There is fat density in the wall of the right and transverse colon which can be a sign of chronic inflammation in the setting of inflammatory bowel disease. However, there is no evidence of acute inflammation of small or large bowel.    Procedures  EGD (02/28/20) Impression:  - Gastritis without bleeding.  - Duodenitis without bleeding.  - Diaphragmatic  hernia without obstruction or gangrene.  - Polyps of stomach.    Colonoscopy (02/28/20) Impression:  - Hemorrhoids.  - Polyp of ascending colon.  - Polyp of transverse colon.  - Polyp of sigmoid colon.    Problem List:  1. Diarrhea  2. RLQ Pain  3. GERD  4. Nausea    Assessment & Plan:  This is a 68 y.o. female with with HTN, HLD, GERD, insomnia, and psoriasis, who presents for evaluation of diarrhea.    She has longstanding IBS but for the past year had worsened diarrhea, with loose stools up tto 10 times a day. She denies blood in her stool, significant abdominal pain, or improvement with Imodium. She reports recent negative celiac testing which she will share  with [place] via MyChart.      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 It typically occurs in middle-aged patients and has a female preponderance. She takes a PPI and sertraline which have both been associated with this disease. A colonoscopy was normal in March 2021 however it is unclear if random biopsies were performed. She has had improvement with daily Cholestyramine which she should continue and titrate as necessary. We will check a fecal calprotectin and elastase to complete her work up. She can try Bentyl PRN for her intermittent abdominal pain. For her nausea/reflux, she should continue Dexilant and consider scheduling Pepcid as opposed to PRN dosing.    Summary of Recommendations:  1. Continue cholestyramine daily (can titrate dose based on symptoms).    2. Bentyl PRN.    3. Patient will share recent celiac testing.    4. Fecal calprotectin and elastase ordered today    5. Continue Dexilant 60 mg daily.    6. Scheduled Pepcid QHS; can continue PRN.    An after visit summary was provided to the patient.     Based on the recommendations above, I believe it is reasonable for the patient to follow-up with you, Dr Gordy Councilman Andria Meuse,  NP. I conveyed my confidence in this plan to the patient and she is amenable. I recommend follow-up for this problem as needed. If the clinical course does not proceed as expected, we are happy to resume specialty care. Please let me know if you are comfortable carrying out the recommendations I have made and if you have any questions.    CC: Erasmo Leventhal, NP

## 2020-10-01 MED ORDER — FAMOTIDINE 40 MG TABLET
40 | ORAL | 2 refills | Status: AC
Start: 2020-10-01 — End: ?

## 2021-05-12 ENCOUNTER — Inpatient Hospital Stay: Admit: 2021-05-12 | Payer: MEDICARE | Primary: Physician

## 2021-05-12 DIAGNOSIS — Z1231 Encounter for screening mammogram for malignant neoplasm of breast: Secondary | ICD-10-CM

## 2021-07-15 ENCOUNTER — Ambulatory Visit: Admit: 2021-07-15 | Discharge: 2021-07-15 | Payer: MEDICARE

## 2021-07-15 ENCOUNTER — Inpatient Hospital Stay: Admit: 2021-07-15 | Payer: MEDICARE | Primary: Physician

## 2021-07-15 DIAGNOSIS — M722 Plantar fascial fibromatosis: Secondary | ICD-10-CM

## 2021-07-15 DIAGNOSIS — M79671 Pain in right foot: Secondary | ICD-10-CM

## 2021-07-15 NOTE — Progress Notes (Signed)
Reason For Visit Comments   Foot Pain      (M72.2) Plantar fasciitis, right  (primary encounter diagnosis)    Vitals:  Ht Readings from Last 1 Encounters:   07/15/21 157.5 cm (5\' 2" )     Wt Readings from Last 1 Encounters:   07/15/21 72.6 kg (160 lb)       Patient ID: Nadiah Corbit is a 70 y.o. female.    Subjective:      Farheen Pfahler presents today for fitting of right foot Ox. Patient has history of Plantar fasciitis, right [M72.2] and currently presents FWB using No assistive device .  Pain 4. S/P treatment. Pt is not improving. Pt is willing to use Ox.  Pt has normal mood and affect.        Objective:          Right foot with 2 rays and small sized foot  Well healed surgical incisions  Neg heel squeeze  Localized sharp pain over plantar medial insertion of plantar fascia  - no TTP over medial or lateral malleoli ankle  - SILT sa/su/sp/dp/t  - Fires TA/EHL/FHL/GS. All 5/5. Peroneal 5/5 too.   - Toes WWP with brisk cap refill <2sec, palpable Dp/post tib          Assessment:         Pt was fit with right XL Ped WEE WALKER to provide immobilization of the foot and ankle and promote healing for their plantar fascitis per Alfredo Martinez, MD.  Pt was educated on don, doff, and care of Ox.  All contact points of the Ox were checked and fit appropriately.  Pt was happy with fit and function of device.  Pt was educated how to perform a skin check and understood.  Integrity of device was evaluated and confirmed.        Plan:         All questions of the patient were answered.  Patient will contact the referring MD for any questions regarding their chief complaint and recovery.  Patient will contact O&P for any questions regarding their Ox.  F/U PRN.

## 2021-07-15 NOTE — Progress Notes (Signed)
Subjective   Date of Visit:  07/15/21     Chief Complaint            New Patient Evaluation Right foot        History of Present Illness   Brenda Romero is a 70 y.o. 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  She currently works at [employer] and gets in / out of cars.    + startup pain and worse when getting out of bed in AM     Patient does not recall a specific traumatic incident. Onset:month(s), worse with walking and exercise and is relieved with rest and ice.     Pain quality: dull, aching and sharp.  Previous treatments have included nothing, tried icing.      Painis rated 7/10 (0=no pain, 10=worst imaginable pain).   For activities, the patient enjoys walking.    Ortho Patient Answers  No flowsheet data found.    Allergies/Contraindications  No Known Allergies  Outpatient Encounter Medications as of 07/15/2021   Medication Sig Dispense Refill    amLODIPine (NORVASC) 5 mg tablet       1 tab orally every day      cholestyramine light (QUESTRAN LIGHT) 4 gram packet 4 g daily         dexlansoprazole (KAPIDEX) 60 mg DR capsule       1 cap orally every day      famotidine (PEPCID) 40 mg tablet TAKE 1 TABLET BY MOUTH EVERY DAY IN THE EVENING 30 tablet 2    pravastatin (PRAVACHOL) 40 mg tablet       1 tab orally every day      sertraline (ZOLOFT) 50 mg tablet 50 mg         traZODone (DESYREL) 50 mg tablet       2 tabs orally every day at bedtime as needed for insomnia       No facility-administered encounter medications on file as of 07/15/2021.     Past Medical History:   Diagnosis Date    Anxiety 2014    Arthritis 2005    Autoimmune disease (CMS code) 2005    Depression 2014    GERD (gastroesophageal reflux disease) January, 2005     Hypertension 2010    Intestinal disease January 2020    Skin disease 1970    Psoriasis     Past Surgical History:   Procedure Laterality Date    CESAREAN SECTION  08/15/1973    EYE SURGERY  2014    Cataracts    HYSTERECTOMY  12/22/2008     Family History   Problem Relation Name Age of Onset    Alzheimer's disease Father Catalina Lunger         Late onset    Cancer Father Catalina Lunger         Leukemia    Arthritis Mother Ileene Rubens     Asthma Mother Ileene Rubens     Cancer Brother Ala Bent         Died leukemia     Social History     Tobacco Use    Smoking status: Former Smoker     Packs/day: 0.50     Years: 20.00     Pack years:  10.00     Types: Cigarettes     Start date: 12/13/1978     Quit date: 12/13/1998     Years since quitting: 22.6    Smokeless tobacco: Never Used   Substance and Sexual Activity    Alcohol use: Yes     Alcohol/week: 2.0 standard drinks     Types: 2 Glasses of wine per week    Drug use: Yes     Frequency: 7.0 times per week     Types: Marijuana    Sexual activity: Not Currently     Occupation:  Delivery driver   Marital Status: Widowed   Tobacco Use: Thepatient  reports that she quit smoking about 22 years ago. Her smoking use included cigarettes. She started smoking about 42 years ago. She has a 10.00 pack-year smoking history. She has never used smokeless tobacco.         Objective      Vitals    Flowsheet Row Most Recent Value   Weight 72.6 kg (160 lb)   Height 157.5 cm (5\' 2" )   Pain Score 7   Pain Loc FOOT   BMI (Calculated) 29.3            Body mass index is 29.26 kg/m.  Physical Exam    ORTHOPAEDIC EXAM:  Ortho Exam    Right foot with 2 rays and small sized foot  Well healed surgical incisions  Neg heel squeeze  Localized sharp pain over plantar medial insertion of plantar fascia  - no TTP over medial or lateral malleoli ankle  - SILT sa/su/sp/dp/t  - Fires TA/EHL/FHL/GS. All 5/5. Peroneal 5/5 too.   - Toes WWP with brisk cap refill <2sec, palpable Dp/post tib      Review  of Prior Testing    I independently reviewed and interpreted the following xrays of the right foot/ankle taken today, including weight bearing images.  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      I reviewed the following tests, documents or external notes: none     Assessment and Plan       Aubriauna Romero is a 70 y.o. year old female with right foot/ankle pain due to below.    Plantar fasciitis, right    -     Ambulatory referral for Orthotics or Prosthetics; Future  -     Ambulatory referral to Physical Therapy; Future  -     [phone]- Pneuma/VAC Walk Boot Pre OTS       The patient was educated extensively about their condition, including the pathology, etiology, natural history, long term prognosis, and treatment options.  Non-operative conservative measures as well as surgical intervention were both addressed. The patient was offered the following conservative, non-operative measures: see below. They will consider their options and contact [facility] accordingly.    The patient voiced understanding and all questions were answered.    Patient Instructions   Right plantar fasciitis - acute since early July 2022    Moderate pain in heel - use short cam boot for 10-14days - pain should improve dramatically    Start physical therapy for stretching    Can stretch with below:           Follow Up: No follow-ups on file.    I personally reviewed and interpreted a test as summarized in the note.    In this  clinic encounter I addressed: 1 acute uncomplicated injury or illness    I spent a total of 26 minutes on this patient's care on the day of their visit excluding time spent related to any billed procedures. This time includes time spent with the patient as well as time spent documenting in the medical record, reviewing patient's records and tests, obtaining history, placing orders, communicating  with other healthcare professionals, counseling the patient, family, or caregiver, and/or care coordination for the diagnoses above.

## 2021-07-15 NOTE — Progress Notes (Addendum)
       Fit and Delivered [facility] [id number] BB Wee Walker Peds XL

## 2021-07-15 NOTE — Addendum Note (Signed)
Addended by: Wayland Denis B on: 07/15/2021 05:40 PM     Modules accepted: Orders

## 2021-07-15 NOTE — Patient Instructions (Signed)
Right plantar fasciitis - acute since early July 2022    Moderate pain in heel - use short cam boot for 10-14days - pain should improve dramatically    Start physical therapy for stretching    Can stretch with below:

## 2021-12-18 ENCOUNTER — Inpatient Hospital Stay: Admit: 2021-12-18 | Discharge: 2021-12-23 | Payer: MEDICARE | Primary: Physician

## 2021-12-18 ENCOUNTER — Inpatient Hospital Stay: Admit: 2021-12-18 | Discharge: 2022-01-05 | Payer: MEDICARE | Primary: Physician

## 2021-12-18 DIAGNOSIS — R0600 Dyspnea, unspecified: Secondary | ICD-10-CM

## 2021-12-18 NOTE — H&P (Signed)
CARDIAC STRESS TEST PRE-PROCEDURE H&P      Referring Provider  Erasmo LeventhalHobson,Alexandra Claire NP    Procedure  Treadmill stress echo    CC/Indication  DOE, chest pain    HPI  Brenda Romero  71 yo female with history of HTN, HLD, asthma, GERD, psoriatic arthritis presents today for stress test to evaluate DOE and chest pain. She reports her shortness of breath with exertion is getting worse over time. Sometimes she experiences left sided chest pain with exertion that feels like squeezing. It resolves with rest. This occurs 2 to 3 times a month and not every time she exercises. She does not think it feels like heart burn or gas. She walks for exercise but finds hills and stairs difficult. She has trouble with balance as well.  Her mother had coronary disease late in life.       PMH  Past Medical History:   Diagnosis Date    Anxiety 2014    Arthritis 2005    Autoimmune disease (CMS code) 2005    Depression 2014    GERD (gastroesophageal reflux disease) January, 2005    Hypertension 2010    Intestinal disease January 2020    Skin disease 1970    Psoriasis       Past Surgeries  Past Surgical History:   Procedure Laterality Date    CESAREAN SECTION  08/15/1973    EYE SURGERY  2014    Cataracts    HYSTERECTOMY  12/22/2008       Social Hx  Social History     Socioeconomic History    Marital status: Widowed     Spouse name: Not on file    Number of children: Not on file    Years of education: Not on file    Highest education level: Not on file   Occupational History    Not on file   Tobacco Use    Smoking status: Former     Packs/day: 0.50     Years: 20.00     Pack years: 10.00     Types: Cigarettes     Start date: 12/13/1978     Quit date: 12/13/1998     Years since quitting: 23.0    Smokeless tobacco: Never   Substance and Sexual Activity    Alcohol use: Yes     Alcohol/week: 2.0 standard drinks     Types: 2 Glasses of wine per week    Drug use: Yes     Frequency: 7.0 times per week     Types: Marijuana     Sexual activity: Not Currently   Other Topics Concern    Not on file   Social History Narrative    Not on file     Social Determinants of Health     Financial Resource Strain: Not on file   Food Insecurity: Not on file   Transportation Needs: Not on file       Family Hx  No known family hx of early heart disease    Allergies  Allergies/Contraindications  No Known Allergies    Medications  Prior to Admission medications    Medication Instructions   amLODIPine (NORVASC) 5 mg tablet       1 tab orally every day   cholestyramine light (QUESTRAN LIGHT) 4 gram packet 4 g daily      dexlansoprazole (KAPIDEX) 60 mg DR capsule       1 cap orally every day   famotidine (PEPCID) 40 mg tablet  TAKE 1 TABLET BY MOUTH EVERY DAY IN THE EVENING   pravastatin (PRAVACHOL) 40 mg tablet       1 tab orally every day   sertraline (ZOLOFT) 50 mg tablet 50 mg      traZODone (DESYREL) 50 mg tablet       2 tabs orally every day at bedtime as needed for insomnia       ROS  Review of Systems   Cardiovascular: Positive for chest pain and dyspnea on exertion. Negative for palpitations.        Physical Exam  Gen: NAD, Alert & oriented x 3  Cardiac: RRR, normal S1/S2, no m/r/g appreciated  Pulmonary: CTA bilaterally, no wheezes    Pertinent Data  No results found for this or any previous visit.        Plan  Treadmill stress echo, standard Bruce    Consent  Risks (such as possible signs/symptoms including but not limited to CP, SOB, arrhythmias, syncope, abnormal vitals, rarely a heart attack or cardiac complication); benefits; and alternatives of procedure (such as coronary angiogram) explained and consented.      Comments/Results  See Imaging/Cardiology tab for final report.      Lucius Conn, NP  [facility]

## 2021-12-31 LAB — ECG 12-LEAD
Atrial Rate: 74 {beats}/min
Calculated P Axis: 45 degrees
Calculated R Axis: 57 degrees
Calculated T Axis: 49 degrees
P-R Interval: 164 ms
QRS Duration: 98 ms
QT Interval: 430 ms
QTcb: 477 ms
Ventricular Rate: 74 {beats}/min

## 2021-12-31 LAB — ECG STRESS REPORT
Max Diastolic BP: 109 mmHg
Max Heart Rate: 139 {beats}/min
Max Predicted Heart Rate: 150 {beats}/min
Max Work Load (METS*10): 46
Max. Systolic BP: 249 mmHg

## 2022-02-05 NOTE — Telephone Encounter (Signed)
Left message on 2/24 for the patient to call back for pre-visit intake for their upcoming video/telephone appointment on 2/27 with Dr. Ashley.     Please forward the caller to ext. [phone].    Thank you,  Laureen Phillip  Medical Assistant 3  [facility]

## 2022-02-08 ENCOUNTER — Ambulatory Visit: Admit: 2022-02-09 | Payer: MEDICARE | Attending: Cardiovascular Disease | Primary: Physician

## 2022-02-08 DIAGNOSIS — R0602 Shortness of breath: Secondary | ICD-10-CM

## 2022-02-08 MED ORDER — FUROSEMIDE 20 MG TABLET
20 | ORAL_TABLET | Freq: Every day | ORAL | 11 refills | Status: AC
Start: 2022-02-08 — End: ?

## 2022-02-08 NOTE — Assessment & Plan Note (Addendum)
Suspect multifactorial related to diastolic LV dysfunction with probable component of HFpEF, deconditioning, obesity, possible ischemia (epicardial vs microvascular). Exertional BP was reasonable, but a significant increase from her baseline which is likely contributing to some of her sxs related to exertional diastolic dysfunction.  -Check labs including proBNP  -TTE with bubble study given iRBBB  -Trial of diuretic therapy  -If no improvement in sxs will proceed with exercise RHC and possibly LHC

## 2022-02-08 NOTE — Patient Instructions (Addendum)
Please check BP/HR once daily at a different time each day for 2 weeks and return your log to the office for review.   Establish a regular exercise program working up to 30-40 minutes of aerobic activity most days and incorporating 2 days of strength training.

## 2022-02-08 NOTE — Telephone Encounter (Signed)
[name], MD sent to [facility]  Please contact [name] and clarify her pharmacy. I tried to call her back to clarify, but she did not answer the phone. The preferred pharmacy in the computer is in [place] and she has moved to [place].   Please Rx Lasix 20 mg po every day for her and send it to me for signature.

## 2022-02-08 NOTE — Assessment & Plan Note (Signed)
Concern that CP may be related to HFpEF. SE (-), but with poor exercise capacity. Ischemia is also still a consideration, epicardial vs microvascular  -Trial of diuretic  -Follow up labs  -If no improvement in sxs will proceed with exercise RHC and possibly LHC

## 2022-02-08 NOTE — Assessment & Plan Note (Signed)
Reports longstanding pravastin therapy. Has discontinued Questran. Notes last labs 4 months ago revealed worsening profile. Has e/o coronary atherosclerosis on imaging from 2021  -Obtain OS records  -Check lipids, apo B, crp  -Goal LDL < 70  -Goal apo B < 80  -Goal crp < 2

## 2022-02-08 NOTE — Progress Notes (Signed)
Subjective    Brenda MartinezBillie Romero is a 71 y.o. female who presents with the following:    Chief Complaint            Chest Pain     Shortness of Breath                 History of Present Illness   71 y/o female with HLD, HTN, pre-DM, coronary atherosclerosis, iRBBB, GERD, psoriasis, autoimmune disease, depression, anxiety presents for CV evaluation in the setting of increasing shortness of breath and chest pain.    She reports that she has been experiencing significant dyspnea with exertion as well as chest discomfort when she overexerts herself.  Initially she thought the symptoms were related to indigestion.  She reports that effort such as carrying her groceries from her car is very difficult.  She has noted that over the course of the last year her effort tolerance has significantly declined.  She notes that she is able to only ambulate approximately 1 flight of stairs.  She does not have a formal exercise program.    She reports that she has begun sleeping on 2 pillows in the course of the last year.  She has also noted some lower extremity edema which improves with elevation of her lower extremities.  She has not had any PND.  She notes that she does snore and often awakens fatigued with a headache and a dry mouth.  She has nocturia 1 time per night.  She does feel that she has some insomnia.  She generally rests on a daily basis but does not always fall asleep.    She notes occasional episodes of heart racing and palpitations but feels that these are very infrequent.  The episodes generally last seconds in duration.  She notes occasional lightheadedness and dizziness.  She experience syncope once during pregnancy when she did not have enough to eat.  She has not had any syncope in her later adult life.    She reports that she does have nausea and has noted a reduction in her appetite.  She notes that she is eating irregularly due to her poor appetite.  She feels that this has been going on for 1 to 2 years.  She  reports a 25 pound unintentional weight loss since 2021.    Allergies/Contraindications  No Known Allergies  Outpatient Encounter Medications as of 02/08/2022   Medication Sig Dispense Refill    amLODIPine (NORVASC) 5 mg tablet       1 tab orally every day      dexlansoprazole (KAPIDEX) 60 mg DR capsule       1 cap orally every day      famotidine (PEPCID) 40 mg tablet TAKE 1 TABLET BY MOUTH EVERY DAY IN THE EVENING 30 tablet 2    pravastatin (PRAVACHOL) 40 mg tablet       1 tab orally every day      sertraline (ZOLOFT) 50 mg tablet 50 mg         traZODone (DESYREL) 50 mg tablet       2 tabs orally every day at bedtime as needed for insomnia      [DISCONTINUED] cholestyramine light (QUESTRAN LIGHT) 4 gram packet 4 g daily          No facility-administered encounter medications on file as of 02/08/2022.     Past Medical History:   Diagnosis Date    Anxiety 2014    Arthritis 2005  Autoimmune disease (CMS code) 2005    Psoriasis    Depression 2014    Endometriosis     GERD (gastroesophageal reflux disease) 12/2003    Headache 1970    Hypertension 2010    Intestinal disease 12/2018    Skin disease 1970    Psoriasis   G2P2, no APOs. Difficulty getting pregnant the second time  PMP, age 27, no HRT    Past Surgical History:   Procedure Laterality Date    CESAREAN SECTION  08/15/1973    EYE SURGERY  2014    Cataracts    HYSTERECTOMY  12/22/2008     Family History   Problem Relation Name Age of Onset    Arthritis Mother Ileene Rubens     Asthma Mother Ileene Rubens     Other (Dementia) Mother Ileene Rubens     Alzheimer's disease Father Catalina Lunger         Late onset    Cancer Father Catalina Lunger         Leukemia    Stroke Father Catalina Lunger         3 strokes at age 27    Cancer Brother Ala Bent         Died leukemia    Heart attack Maternal Grandmother  89    Other (Carotid stenosis) Paternal Grandfather          Died early 25s    Other Multimedia programmer) Nephew  20    Sudden cardiac death Neg  Hx         Social History     Tobacco Use    Smoking status: Former     Packs/day: 0.50     Years: 20.00     Pack years: 10.00     Types: Cigarettes     Start date: 12/13/1978     Quit date: 12/13/1998     Years since quitting: 23.1    Smokeless tobacco: Never    Tobacco comments:     Started smoking at 54, smoked until age 5. Smoked 3/4's of a ppd at her peak.   Substance and Sexual Activity    Alcohol use: Yes     Alcohol/week: 2.0 standard drinks     Types: 2 Glasses of wine per week    Drug use: Yes     Frequency: 7.0 times per week     Types: Marijuana     Comment: Sleep marijuana gummies    Sexual activity: Not Currently   Social History Narrative    Widowed. 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             ROS: Aside from that described in the HPI is notable for a 5 pound weight loss since 2021.  Headaches once or twice a week which improved with rest and hydration.  Easy bruising.  Heartburn for which she takes a PPI.  IBS symptoms somewhat improved with reduction in gluten in her diet.  She does report fecal urgency every morning associated with abdominal pain.  The abdominal pain resolves after defecation.  Arthritis predominantly of the hands and knees.  Forgetfulness.  Anxiety and depression.  The remainder of the 14 point ROS is (-).     Objective      Patient Entered Counselling psychologist Row Most Recent Value   Temp 97.3 (P)     Temp src Mouth (P)  Weight (lbs) 160 (P)     Weight (kg) 72.73 (P)             Physical Exam  Gen: NAD, pleasant  Lungs: No respiratory distress. No use of accessory muscles, no cough. Speaking in full sentences with no extra respiratory sounds.   Psyche: Alert and oriented to person, place, and time. Normal fluent speech; communicative and interactive. Normal affect and mildly anxious mood       Review of Prior Testing  DATA:  I have personally reviewed the following data:     SE 3:01 minutes, 91% PMHR, BP 172/80 mmHg. Exercise  induced BBB, electrically (-). No echo evidence of ischemia, normal LV augmentation    Chest CT 04/29/20:  LUNGS:  Lungs are clear. 3mm nodule in the left lower lobe (series 2, image 172).  PLEURA:  No pleural effusion.  MEDIASTINUM:   No mediastinal lymphadenopathy by CT criteria.  HEART/GREAT VESSELS:  Normal heart size. No pericardial effusion. Normal caliber of the ascending thoracic aorta. Mild coronary calcification.  BONES/SOFT TISSUES:  No suspicious osseous lesion.  VISIBLE ABDOMEN:  Limited evaluation of the upper abdomen is normal.      ECG 12/18/21:  I have independently reviewed and interpreted this ECG which reveals NSR, iRBBB, nonspecific ST/TW abnormality.       Assessment and Plan         Brenda MartinezBillie Romero is a 71 y.o. female with HLD, HTN, coronary atherosclerosis, iRBBB, GERD, psoriasis, autoimmune disease, depression, anxiety presents for CV evaluation in the setting of increasing shortness of breath and chest pain.         Visit Diagnoses     SOB (shortness of breath) - Primary    Current Assessment & Plan     Suspect multifactorial related to diastolic LV dysfunction with probable component of HFpEF, deconditioning, obesity, possible ischemia (epicardial vs microvascular). Exertional BP was reasonable, but a significant increase from her baseline which is likely contributing to some of her sxs related to exertional diastolic dysfunction.  -Check labs including proBNP  -TTE with bubble study given iRBBB  -Trial of diuretic therapy  -If no improvement in sxs will proceed with exercise RHC and possibly LHC         Relevant Orders    Comprehensive Metabolic Panel (BMP, AST, ALT, T.BILI, ALKP, TP, ALB)    Pro-B-Type Natriuretic Peptide, N-terminal    TransThoracic Echo [facility]    Basic Metabolic Panel    Pro-B-Type Natriuretic Peptide, N-terminal        Precordial pain    Current Assessment & Plan     Concern that CP may be related to HFpEF. SE (-), but with poor exercise capacity. Ischemia is also  still a consideration, epicardial vs microvascular  -Trial of diuretic  -Follow up labs  -If no improvement in sxs will proceed with exercise RHC and possibly LHC         Relevant Orders    Comprehensive Metabolic Panel (BMP, AST, ALT, T.BILI, ALKP, TP, ALB)    [facility]Thoracic Echo [facility])        Essential hypertension    Current Assessment & Plan     Reasonable exertional BP response. Significant increase from baseline BP though, likely in part related to deconditioning and probable diastolic dysfunction.  -Home BP/HR log  -Check labs             Hyperlipidemia    Current Assessment & Plan     Reports longstanding pravastin  therapy. Has discontinued Questran. Notes last labs 4 months ago revealed worsening profile. Has e/o coronary atherosclerosis on imaging from 2021  -Obtain OS records  -Check lipids, apo B, crp  -Goal LDL < 70  -Goal apo B < 80  -Goal crp < 2         Relevant Orders    Lipid Panel (incl. LDL, HDL, Total Chol. and Trig.)    Apolipoprotein B    C-Reactive Protein, Highly Sensitive       Other Visit Diagnoses (not on the problem list)     Palpitations        Occasional.   -WIll monitor for now  -Low threshold for cardiac monitoring    Relevant Orders    Comprehensive Metabolic Panel (BMP, AST, ALT, T.BILI, ALKP, TP, ALB)    TransThoracic Echo [facility])    Pre-diabetes        -OS records requested                 I reviewed external records from 2 providers outside my specialty or healthcare organization as summarized in the note. I personally reviewed and interpreted a test as summarized in the note.    I spent a total of 70 minutes on this patient's care on the day of their visit excluding time spent related to any billed procedures. This time includes time spent with the patient as well as time spent documenting in the medical record, reviewing patient's records and tests, obtaining history, placing orders, communicating with other healthcare professionals, counseling the patient, family,  or caregiver, and/or care coordination for the diagnoses above.        I performed this evaluation using real-time telehealth tools, including a live video Zoom connection between my location and the patient's location. Prior to initiating, the patient consented to perform this evaluation using telehealth tools. My location is in a [facility] clinical facility.                 I have provided the following written instructions to the patient in the After Visit Summary:   Patient Instructions   Please check BP/HR once daily at a different time each day for 2 weeks and return your log to the office for review.   Establish a regular exercise program working up to 30-40 minutes of aerobic activity most days and incorporating 2 days of strength training.

## 2022-02-08 NOTE — Assessment & Plan Note (Signed)
Reasonable exertional BP response. Significant increase from baseline BP though, likely in part related to deconditioning and probable diastolic dysfunction.  -Home BP/HR log  -Check labs

## 2022-02-13 LAB — APOLIPOPROTEIN B: Apolipoprotein B: 129 mg/dL — ABNORMAL HIGH (ref <90)
